# Patient Record
Sex: Female | Born: 1960 | Race: White | Hispanic: No | Marital: Married | Smoking: Never smoker
Health system: Southern US, Community
[De-identification: ages and names within clinical notes are randomized; demographics above are authoritative.]

## PROBLEM LIST (undated history)

## (undated) DIAGNOSIS — Z9889 Other specified postprocedural states: Secondary | ICD-10-CM

## (undated) DIAGNOSIS — N809 Endometriosis, unspecified: Secondary | ICD-10-CM

## (undated) DIAGNOSIS — J45909 Unspecified asthma, uncomplicated: Secondary | ICD-10-CM

## (undated) DIAGNOSIS — C4490 Unspecified malignant neoplasm of skin, unspecified: Secondary | ICD-10-CM

## (undated) DIAGNOSIS — E785 Hyperlipidemia, unspecified: Secondary | ICD-10-CM

## (undated) DIAGNOSIS — R112 Nausea with vomiting, unspecified: Secondary | ICD-10-CM

## (undated) DIAGNOSIS — E039 Hypothyroidism, unspecified: Secondary | ICD-10-CM

## (undated) DIAGNOSIS — M1990 Unspecified osteoarthritis, unspecified site: Secondary | ICD-10-CM

## (undated) DIAGNOSIS — Z87442 Personal history of urinary calculi: Secondary | ICD-10-CM

## (undated) DIAGNOSIS — G4700 Insomnia, unspecified: Secondary | ICD-10-CM

## (undated) DIAGNOSIS — Z8669 Personal history of other diseases of the nervous system and sense organs: Secondary | ICD-10-CM

## (undated) DIAGNOSIS — J189 Pneumonia, unspecified organism: Secondary | ICD-10-CM

## (undated) DIAGNOSIS — K219 Gastro-esophageal reflux disease without esophagitis: Secondary | ICD-10-CM

## (undated) HISTORY — PX: URETHRAL DILATION: SUR417

## (undated) HISTORY — PX: LAPAROSCOPY: SHX197

## (undated) HISTORY — PX: SKIN CANCER EXCISION: SHX779

## (undated) HISTORY — PX: AUGMENTATION MAMMAPLASTY: SUR837

## (undated) HISTORY — PX: ABDOMINAL HYSTERECTOMY: SHX81

---

## 2010-03-06 ENCOUNTER — Encounter: Admission: RE | Admit: 2010-03-06 | Discharge: 2010-03-06 | Payer: Self-pay | Admitting: Family Medicine

## 2014-06-28 ENCOUNTER — Other Ambulatory Visit: Payer: Self-pay

## 2014-06-28 DIAGNOSIS — Z1231 Encounter for screening mammogram for malignant neoplasm of breast: Secondary | ICD-10-CM

## 2014-08-09 ENCOUNTER — Ambulatory Visit
Admission: RE | Admit: 2014-08-09 | Discharge: 2014-08-09 | Disposition: A | Discharge status: Home / Self Care | Payer: BLUE CROSS/BLUE SHIELD | Source: Ambulatory Visit

## 2014-08-09 DIAGNOSIS — Z1231 Encounter for screening mammogram for malignant neoplasm of breast: Secondary | ICD-10-CM

## 2016-05-27 DIAGNOSIS — J45909 Unspecified asthma, uncomplicated: Secondary | ICD-10-CM | POA: Diagnosis not present

## 2016-06-17 ENCOUNTER — Other Ambulatory Visit: Payer: Self-pay | Admitting: Nurse Practitioner

## 2016-06-17 DIAGNOSIS — Z1231 Encounter for screening mammogram for malignant neoplasm of breast: Secondary | ICD-10-CM

## 2016-06-17 DIAGNOSIS — R5381 Other malaise: Secondary | ICD-10-CM

## 2016-06-18 ENCOUNTER — Other Ambulatory Visit: Payer: Self-pay | Admitting: Nurse Practitioner

## 2016-06-18 DIAGNOSIS — E2839 Other primary ovarian failure: Secondary | ICD-10-CM

## 2016-08-01 ENCOUNTER — Ambulatory Visit
Admission: RE | Admit: 2016-08-01 | Discharge: 2016-08-01 | Disposition: A | Discharge status: Home / Self Care | Payer: BLUE CROSS/BLUE SHIELD | Source: Ambulatory Visit | Attending: Nurse Practitioner | Admitting: Nurse Practitioner

## 2016-08-01 DIAGNOSIS — Z780 Asymptomatic menopausal state: Secondary | ICD-10-CM | POA: Diagnosis not present

## 2016-08-01 DIAGNOSIS — E2839 Other primary ovarian failure: Secondary | ICD-10-CM

## 2016-08-01 DIAGNOSIS — Z1231 Encounter for screening mammogram for malignant neoplasm of breast: Secondary | ICD-10-CM

## 2016-08-01 DIAGNOSIS — Z13820 Encounter for screening for osteoporosis: Secondary | ICD-10-CM | POA: Diagnosis not present

## 2016-08-06 ENCOUNTER — Other Ambulatory Visit: Payer: Self-pay | Admitting: Nurse Practitioner

## 2016-08-06 DIAGNOSIS — R928 Other abnormal and inconclusive findings on diagnostic imaging of breast: Secondary | ICD-10-CM

## 2016-08-13 ENCOUNTER — Ambulatory Visit
Admission: RE | Admit: 2016-08-13 | Discharge: 2016-08-13 | Disposition: A | Discharge status: Home / Self Care | Payer: BLUE CROSS/BLUE SHIELD | Source: Ambulatory Visit | Attending: Nurse Practitioner | Admitting: Nurse Practitioner

## 2016-08-13 DIAGNOSIS — R928 Other abnormal and inconclusive findings on diagnostic imaging of breast: Secondary | ICD-10-CM

## 2016-08-13 DIAGNOSIS — N6311 Unspecified lump in the right breast, upper outer quadrant: Secondary | ICD-10-CM | POA: Diagnosis not present

## 2016-09-25 DIAGNOSIS — E039 Hypothyroidism, unspecified: Secondary | ICD-10-CM | POA: Diagnosis not present

## 2016-09-25 DIAGNOSIS — E781 Pure hyperglyceridemia: Secondary | ICD-10-CM | POA: Diagnosis not present

## 2016-09-25 DIAGNOSIS — E782 Mixed hyperlipidemia: Secondary | ICD-10-CM | POA: Diagnosis not present

## 2016-09-30 DIAGNOSIS — J4520 Mild intermittent asthma, uncomplicated: Secondary | ICD-10-CM | POA: Diagnosis not present

## 2016-09-30 DIAGNOSIS — G4700 Insomnia, unspecified: Secondary | ICD-10-CM | POA: Diagnosis not present

## 2016-09-30 DIAGNOSIS — E782 Mixed hyperlipidemia: Secondary | ICD-10-CM | POA: Diagnosis not present

## 2016-09-30 DIAGNOSIS — E039 Hypothyroidism, unspecified: Secondary | ICD-10-CM | POA: Diagnosis not present

## 2016-11-25 DIAGNOSIS — R946 Abnormal results of thyroid function studies: Secondary | ICD-10-CM | POA: Diagnosis not present

## 2016-11-25 DIAGNOSIS — M170 Bilateral primary osteoarthritis of knee: Secondary | ICD-10-CM | POA: Diagnosis not present

## 2017-02-19 DIAGNOSIS — R21 Rash and other nonspecific skin eruption: Secondary | ICD-10-CM | POA: Diagnosis not present

## 2017-02-19 DIAGNOSIS — L03811 Cellulitis of head [any part, except face]: Secondary | ICD-10-CM | POA: Diagnosis not present

## 2017-04-30 DIAGNOSIS — I10 Essential (primary) hypertension: Secondary | ICD-10-CM | POA: Diagnosis not present

## 2017-04-30 DIAGNOSIS — R739 Hyperglycemia, unspecified: Secondary | ICD-10-CM | POA: Diagnosis not present

## 2017-04-30 DIAGNOSIS — E7800 Pure hypercholesterolemia, unspecified: Secondary | ICD-10-CM | POA: Diagnosis not present

## 2017-04-30 DIAGNOSIS — Z23 Encounter for immunization: Secondary | ICD-10-CM | POA: Diagnosis not present

## 2017-05-19 DIAGNOSIS — H43812 Vitreous degeneration, left eye: Secondary | ICD-10-CM | POA: Diagnosis not present

## 2017-05-19 DIAGNOSIS — H43822 Vitreomacular adhesion, left eye: Secondary | ICD-10-CM | POA: Diagnosis not present

## 2017-05-19 DIAGNOSIS — H3581 Retinal edema: Secondary | ICD-10-CM | POA: Diagnosis not present

## 2017-06-23 DIAGNOSIS — H43822 Vitreomacular adhesion, left eye: Secondary | ICD-10-CM | POA: Diagnosis not present

## 2017-06-23 DIAGNOSIS — H3581 Retinal edema: Secondary | ICD-10-CM | POA: Diagnosis not present

## 2017-08-24 DIAGNOSIS — H3581 Retinal edema: Secondary | ICD-10-CM | POA: Diagnosis not present

## 2017-08-24 DIAGNOSIS — L02223 Furuncle of chest wall: Secondary | ICD-10-CM | POA: Diagnosis not present

## 2017-08-24 DIAGNOSIS — H43822 Vitreomacular adhesion, left eye: Secondary | ICD-10-CM | POA: Diagnosis not present

## 2017-09-21 ENCOUNTER — Other Ambulatory Visit: Payer: Self-pay | Admitting: Family Medicine

## 2017-09-21 DIAGNOSIS — Z139 Encounter for screening, unspecified: Secondary | ICD-10-CM

## 2017-10-08 ENCOUNTER — Encounter: Payer: Self-pay | Admitting: Radiology

## 2017-10-08 ENCOUNTER — Ambulatory Visit
Admission: RE | Admit: 2017-10-08 | Discharge: 2017-10-08 | Disposition: A | Discharge status: Home / Self Care | Payer: BLUE CROSS/BLUE SHIELD | Source: Ambulatory Visit | Attending: Family Medicine | Admitting: Family Medicine

## 2017-10-08 DIAGNOSIS — Z139 Encounter for screening, unspecified: Secondary | ICD-10-CM

## 2017-10-08 DIAGNOSIS — Z1231 Encounter for screening mammogram for malignant neoplasm of breast: Secondary | ICD-10-CM | POA: Diagnosis not present

## 2017-10-12 ENCOUNTER — Other Ambulatory Visit: Payer: Self-pay | Admitting: Family Medicine

## 2017-10-12 DIAGNOSIS — R928 Other abnormal and inconclusive findings on diagnostic imaging of breast: Secondary | ICD-10-CM

## 2017-10-26 ENCOUNTER — Ambulatory Visit: Payer: BLUE CROSS/BLUE SHIELD

## 2017-10-26 ENCOUNTER — Ambulatory Visit
Admission: RE | Admit: 2017-10-26 | Discharge: 2017-10-26 | Disposition: A | Discharge status: Home / Self Care | Payer: BLUE CROSS/BLUE SHIELD | Source: Ambulatory Visit | Attending: Family Medicine | Admitting: Family Medicine

## 2017-10-26 DIAGNOSIS — R922 Inconclusive mammogram: Secondary | ICD-10-CM | POA: Diagnosis not present

## 2017-10-26 DIAGNOSIS — R928 Other abnormal and inconclusive findings on diagnostic imaging of breast: Secondary | ICD-10-CM

## 2017-11-11 DIAGNOSIS — L720 Epidermal cyst: Secondary | ICD-10-CM | POA: Diagnosis not present

## 2018-02-27 DIAGNOSIS — H00016 Hordeolum externum left eye, unspecified eyelid: Secondary | ICD-10-CM | POA: Diagnosis not present

## 2018-02-27 DIAGNOSIS — R220 Localized swelling, mass and lump, head: Secondary | ICD-10-CM | POA: Diagnosis not present

## 2018-07-26 DIAGNOSIS — M1712 Unilateral primary osteoarthritis, left knee: Secondary | ICD-10-CM | POA: Diagnosis not present

## 2018-07-26 DIAGNOSIS — M25562 Pain in left knee: Secondary | ICD-10-CM | POA: Diagnosis not present

## 2018-08-19 DIAGNOSIS — M25562 Pain in left knee: Secondary | ICD-10-CM | POA: Diagnosis not present

## 2018-08-25 DIAGNOSIS — M25562 Pain in left knee: Secondary | ICD-10-CM | POA: Diagnosis not present

## 2018-08-27 DIAGNOSIS — M25562 Pain in left knee: Secondary | ICD-10-CM | POA: Diagnosis not present

## 2018-08-30 DIAGNOSIS — M25562 Pain in left knee: Secondary | ICD-10-CM | POA: Diagnosis not present

## 2018-09-07 DIAGNOSIS — M25562 Pain in left knee: Secondary | ICD-10-CM | POA: Diagnosis not present

## 2018-09-09 DIAGNOSIS — M5432 Sciatica, left side: Secondary | ICD-10-CM | POA: Diagnosis not present

## 2018-09-15 DIAGNOSIS — M25562 Pain in left knee: Secondary | ICD-10-CM | POA: Diagnosis not present

## 2018-09-24 DIAGNOSIS — M25562 Pain in left knee: Secondary | ICD-10-CM | POA: Diagnosis not present

## 2018-10-01 DIAGNOSIS — M25562 Pain in left knee: Secondary | ICD-10-CM | POA: Diagnosis not present

## 2018-10-13 DIAGNOSIS — M25562 Pain in left knee: Secondary | ICD-10-CM | POA: Diagnosis not present

## 2019-02-01 DIAGNOSIS — R3915 Urgency of urination: Secondary | ICD-10-CM | POA: Diagnosis not present

## 2019-06-09 DIAGNOSIS — Z87898 Personal history of other specified conditions: Secondary | ICD-10-CM | POA: Diagnosis not present

## 2019-06-09 DIAGNOSIS — Z6832 Body mass index (BMI) 32.0-32.9, adult: Secondary | ICD-10-CM | POA: Diagnosis not present

## 2019-06-09 DIAGNOSIS — E7800 Pure hypercholesterolemia, unspecified: Secondary | ICD-10-CM | POA: Diagnosis not present

## 2019-10-06 ENCOUNTER — Ambulatory Visit: Payer: BC Managed Care – PPO | Attending: Internal Medicine

## 2019-10-06 DIAGNOSIS — Z23 Encounter for immunization: Secondary | ICD-10-CM | POA: Insufficient documentation

## 2019-10-06 NOTE — Progress Notes (Signed)
   Covid-19 Vaccination Clinic  Name:  [name]    MRN: [id number] DOB: July 24, 1961  10/06/2019  [name] was observed post Covid-19 immunization for 30 minutes based on pre-vaccination screening without incident. She was provided with Vaccine Information Sheet and instruction to access the V-Safe system.   [name] was instructed to call 911 with any severe reactions post vaccine: Marland Kitchen Difficulty breathing  . Swelling of face and throat  . A fast heartbeat  . A bad rash all over body  . Dizziness and weakness   Immunizations Administered    Name Date Dose VIS Date Route   Pfizer COVID-19 Vaccine 10/06/2019  8:25 AM 0.3 mL 07/15/2019 Intramuscular   Manufacturer: [place]   Lot: [id number]   [place]: [id number]

## 2019-11-02 ENCOUNTER — Ambulatory Visit: Payer: BC Managed Care – PPO | Attending: Internal Medicine

## 2019-11-02 DIAGNOSIS — Z23 Encounter for immunization: Secondary | ICD-10-CM

## 2019-11-02 NOTE — Progress Notes (Signed)
   Covid-19 Vaccination Clinic  Name:  [name]    MRN: [id number] DOB: 05/03/1961  11/02/2019  [name] was observed post Covid-19 immunization for 15 minutes without incident. She was provided with Vaccine Information Sheet and instruction to access the V-Safe system.   [name] was instructed to call 911 with any severe reactions post vaccine: Marland Kitchen Difficulty breathing  . Swelling of face and throat  . A fast heartbeat  . A bad rash all over body  . Dizziness and weakness   Immunizations Administered    Name Date Dose VIS Date Route   Pfizer COVID-19 Vaccine 11/02/2019 10:00 AM 0.3 mL 07/15/2019 Intramuscular   Manufacturer: [place]   Lot: [id number]   [place]: [id number]

## 2019-12-13 ENCOUNTER — Other Ambulatory Visit: Payer: Self-pay | Admitting: Family Medicine

## 2019-12-13 DIAGNOSIS — Z1231 Encounter for screening mammogram for malignant neoplasm of breast: Secondary | ICD-10-CM

## 2020-02-17 ENCOUNTER — Other Ambulatory Visit: Payer: Self-pay

## 2020-02-17 ENCOUNTER — Ambulatory Visit
Admission: RE | Admit: 2020-02-17 | Discharge: 2020-02-17 | Disposition: A | Discharge status: Home / Self Care | Payer: BC Managed Care – PPO | Source: Ambulatory Visit | Attending: Family Medicine | Admitting: Family Medicine

## 2020-02-17 DIAGNOSIS — Z1231 Encounter for screening mammogram for malignant neoplasm of breast: Secondary | ICD-10-CM | POA: Diagnosis not present

## 2020-04-30 DIAGNOSIS — M25512 Pain in left shoulder: Secondary | ICD-10-CM | POA: Diagnosis not present

## 2020-04-30 DIAGNOSIS — M542 Cervicalgia: Secondary | ICD-10-CM | POA: Diagnosis not present

## 2020-04-30 DIAGNOSIS — M25561 Pain in right knee: Secondary | ICD-10-CM | POA: Diagnosis not present

## 2020-05-14 DIAGNOSIS — N6489 Other specified disorders of breast: Secondary | ICD-10-CM | POA: Diagnosis not present

## 2020-05-14 DIAGNOSIS — E7800 Pure hypercholesterolemia, unspecified: Secondary | ICD-10-CM | POA: Diagnosis not present

## 2020-05-14 DIAGNOSIS — I10 Essential (primary) hypertension: Secondary | ICD-10-CM | POA: Diagnosis not present

## 2020-05-14 DIAGNOSIS — Z87898 Personal history of other specified conditions: Secondary | ICD-10-CM | POA: Diagnosis not present

## 2020-06-05 DIAGNOSIS — N6489 Other specified disorders of breast: Secondary | ICD-10-CM | POA: Diagnosis not present

## 2020-10-16 DIAGNOSIS — M1711 Unilateral primary osteoarthritis, right knee: Secondary | ICD-10-CM | POA: Diagnosis not present

## 2020-10-22 DIAGNOSIS — M1711 Unilateral primary osteoarthritis, right knee: Secondary | ICD-10-CM | POA: Diagnosis not present

## 2020-10-23 DIAGNOSIS — I10 Essential (primary) hypertension: Secondary | ICD-10-CM | POA: Diagnosis not present

## 2020-10-23 DIAGNOSIS — M25569 Pain in unspecified knee: Secondary | ICD-10-CM | POA: Diagnosis not present

## 2020-11-07 ENCOUNTER — Ambulatory Visit (INDEPENDENT_AMBULATORY_CARE_PROVIDER_SITE_OTHER): Payer: BC Managed Care – PPO | Admitting: Orthopaedic Surgery

## 2020-11-07 VITALS — Ht 67.0 in | Wt 200.0 lb

## 2020-11-07 DIAGNOSIS — M1711 Unilateral primary osteoarthritis, right knee: Secondary | ICD-10-CM | POA: Diagnosis not present

## 2020-11-07 DIAGNOSIS — G8929 Other chronic pain: Secondary | ICD-10-CM

## 2020-11-07 DIAGNOSIS — M25561 Pain in right knee: Secondary | ICD-10-CM | POA: Diagnosis not present

## 2020-11-07 NOTE — Progress Notes (Signed)
Office Visit Note   Patient: [name]           Date of Birth: 25-Jun-1961           MRN: [id number] Visit Date: 11/07/2020              Requested by: Glenford Bayley, DO [place],  [place] [address] PCP: Glenford Bayley, DO   Assessment & Plan: Visit Diagnoses:  1. Chronic pain of right knee   2. Unilateral primary osteoarthritis, right knee     Plan: At this point I agree with the other orthopedic surgeon that a knee replacement can be helpful for her.  I showed her knee model explained in detail what the surgery involves including a better understanding of the risk and benefits of surgery.  We talked about the interoperative and postoperative course and what to expect.  Given the failure of conservative treatment for over 12 years including all the modalities previously listed, I do feel she is a candidate for this as does she.  She is interested in having this scheduled soon.  All questions and concerns were answered and addressed.  Follow-Up Instructions: Return for 2 weeks post-op.   Orders:  No orders of the defined types were placed in this encounter.  No orders of the defined types were placed in this encounter.     Procedures: No procedures performed   Clinical Data: No additional findings.   Subjective: Chief Complaint  Patient presents with  . Right Knee - Pain  The patient has some I am seeing for the first time but she is asked to see an orthopedic colleague in town who is recommended knee replacement surgery to her.  She came to me at the request of her friend of hers.  She has been dealing with right knee pain for many years now.  She has never had surgery on that knee but she has been documented with having severe end-stage arthritis of the right knee.  She has tried and failed other forms of conservative treatment including activity modification, holding back on her activities, weight loss, anti-inflammatories and numerous injections.  At this point her  right knee pain is definitely affecting her mobility, her quality of life and her actives daily living.  She is someone who is an avid outdoor person and does do a lot of hiking and is very active with her husband in terms of the activities of like to do outside.  She does get a lot of pain at night that wakes her up at night.  It is very stiff when she has been sitting for long period time or riding in a car.  The knee also pops and cracks quite a bit with her right knee.  She is not a diabetic.  She is not on blood thinning medications.  She has asthma.  She is not a smoker.  HPI  Review of Systems She currently denies any headache, chest pain, shortness of breath, fever, chills, nausea, vomiting  Objective: Vital Signs: Ht 5\' 7"  (1.702 m)   Wt 200 lb (90.7 kg)   BMI 31.32 kg/m   Physical Exam She is alert and orient x3 and in no acute distress Ortho Exam Examination of the right knee show significant patellofemoral crepitation.  There is some warmth in the knee and a mild effusion.  She has varus malalignment that is correctable.  Knee is ligamentously stable but has a painful arc  of motion that its arc of motion. Specialty Comments:  No specialty comments available.  Imaging: No results found. X-rays that accompany her of the right knee are independently reviewed.  This shows severe end-stage arthritis with varus malalignment, complete loss of the medial joint space and patellofemoral arthritic changes that are quite severe.  There are osteophytes in all 3 compartments.  PMFS History: Patient Active Problem List   Diagnosis Date Noted  . Unilateral primary osteoarthritis, right knee 11/07/2020   No past medical history on file.  No family history on file.   Social History   Occupational History  . Not on file  Tobacco Use  . Smoking status: Not on file  . Smokeless tobacco: Not on file  Substance and Sexual Activity  . Alcohol use: Not on file  . Drug use: Not on file  .  Sexual activity: Not on file

## 2020-11-27 ENCOUNTER — Other Ambulatory Visit: Payer: Self-pay

## 2020-12-04 ENCOUNTER — Other Ambulatory Visit: Payer: Self-pay | Admitting: Physician Assistant

## 2020-12-07 ENCOUNTER — Encounter (HOSPITAL_COMMUNITY): Payer: Self-pay

## 2020-12-07 NOTE — Patient Instructions (Addendum)
DUE TO COVID-19 ONLY ONE VISITOR IS ALLOWED TO COME WITH YOU AND STAY IN THE WAITING ROOM ONLY DURING PRE OP AND PROCEDURE.   **NO VISITORS ARE ALLOWED IN THE SHORT STAY AREA OR RECOVERY ROOM!!**  IF YOU WILL BE ADMITTED INTO THE HOSPITAL YOU ARE ALLOWED ONLY TWO SUPPORT PEOPLE DURING VISITATION HOURS ONLY (10AM -8PM)   . The support person(s) may change daily. . The support person(s) must pass our screening, gel in and out, and wear a mask at all times, including in the patient's room. . Patients must also wear a mask when staff or their support person are in the room.  No visitors under the age of 18. Any visitor under the age of 10 must be accompanied by an adult.    COVID SWAB TESTING MUST BE COMPLETED ON:  Wednesday, 12-19-20 @ 9:30 AM   [address] [place], [place] [address]         Your procedure is scheduled on:  Friday, 12-21-20   Report to [facility] Main  Entrance   Report to Short Stay at 5:15 AM   [facility])   Call this number if you have problems the morning of surgery [phone]   Do not eat food :After Midnight.   May have liquids until 4:15 AM day of surgery  CLEAR LIQUID DIET  Foods Allowed                                                                     Foods Excluded  Water, Black Coffee and tea, regular and decaf            liquids that you cannot  Plain Jell-O in any flavor  (No red)                                   see through such as: Fruit ices (not with fruit pulp)                                      milk, soups, orange juice              Iced Popsicles (No red)                                      All solid food                                   Apple juices Sports drinks like Gatorade (No red) Lightly seasoned clear broth or consume(fat free) Sugar, honey syrup     Complete one Ensure drink the morning of surgery at 4:15 AM  the day of surgery.      1. The day of surgery:  ? Drink ONE (1) Pre-Surgery Clear Ensure or G2 by  am the morning of surgery. Drink in one sitting. Do not sip.  ? This drink was given to you during your hospital  pre-op appointment  visit. ? Nothing else to drink after completing the  Pre-Surgery Clear Ensure or G2.          If you have questions, please contact your surgeon's office.     Oral Hygiene is also important to reduce your risk of infection.                                    Remember - BRUSH YOUR TEETH THE MORNING OF SURGERY WITH YOUR REGULAR TOOTHPASTE   Do NOT smoke after Midnight   Take these medicines the morning of surgery with A SIP OF WATER: Pantoprazole.  Okay to use Albuterol inhaler and bring with you day of  surgery                                You may not have any metal on your body including hair pins, jewelry, and body piercings             Do not wear make-up, lotions, powders, perfumes/cologne, or deodorant             Do not wear nail polish.  Do not shave  48 hours prior to surgery.             Do not bring valuables to the hospital. [place].   Contacts, dentures or bridgework may not be worn into surgery.   Bring small overnight bag day of surgery.                 Please read over the following fact sheets you were given: IF YOU HAVE QUESTIONS ABOUT YOUR PRE OP INSTRUCTIONS PLEASE CALL  [facility] - Preparing for Surgery Before surgery, you can play an important role.  Because skin is not sterile, your skin needs to be as free of germs as possible.  You can reduce the number of germs on your skin by washing with CHG (chlorahexidine gluconate) soap before surgery.  CHG is an antiseptic cleaner which kills germs and bonds with the skin to continue killing germs even after washing. Please DO NOT use if you have an allergy to CHG or antibacterial soaps.  If your skin becomes reddened/irritated stop using the CHG and inform your nurse when you arrive at Short Stay. Do not shave (including legs  and underarms) for at least 48 hours prior to the first CHG shower.  You may shave your face/neck.  Please follow these instructions carefully:  1.  Shower with CHG Soap the night before surgery and the  morning of surgery.  2.  If you choose to wash your hair, wash your hair first as usual with your normal  shampoo.  3.  After you shampoo, rinse your hair and body thoroughly to remove the shampoo.                             4.  Use CHG as you would any other liquid soap.  You can apply chg directly to the skin and wash.  Gently with a scrungie or clean washcloth.  5.  Apply the CHG Soap to your body ONLY FROM THE NECK DOWN.   Do   not use on face/ open  Wound or open sores. Avoid contact with eyes, ears mouth and   genitals (private parts).                       Wash face,  Genitals (private parts) with your normal soap.             6.  Wash thoroughly, paying special attention to the area where your    surgery  will be performed.  7.  Thoroughly rinse your body with warm water from the neck down.  8.  DO NOT shower/wash with your normal soap after using and rinsing off the CHG Soap.                9.  Pat yourself dry with a clean towel.            10.  Wear clean pajamas.            11.  Place clean sheets on your bed the night of your first shower and do not  sleep with pets. Day of Surgery : Do not apply any lotions/deodorants the morning of surgery.  Please wear clean clothes to the hospital/surgery center.  FAILURE TO FOLLOW THESE INSTRUCTIONS MAY RESULT IN THE CANCELLATION OF YOUR SURGERY  PATIENT SIGNATURE_________________________________  NURSE SIGNATURE__________________________________  ________________________________________________________________________   Adam Phenix  An incentive spirometer is a tool that can help keep your lungs clear and active. This tool measures how well you are filling your lungs with each breath. Taking long  deep breaths may help reverse or decrease the chance of developing breathing (pulmonary) problems (especially infection) following:  A long period of time when you are unable to move or be active. BEFORE THE PROCEDURE   If the spirometer includes an indicator to show your best effort, your nurse or respiratory therapist will set it to a desired goal.  If possible, sit up straight or lean slightly forward. Try not to slouch.  Hold the incentive spirometer in an upright position. INSTRUCTIONS FOR USE  1. Sit on the edge of your bed if possible, or sit up as far as you can in bed or on a chair. 2. Hold the incentive spirometer in an upright position. 3. Breathe out normally. 4. Place the mouthpiece in your mouth and seal your lips tightly around it. 5. Breathe in slowly and as deeply as possible, raising the piston or the ball toward the top of the column. 6. Hold your breath for 3-5 seconds or for as long as possible. Allow the piston or ball to fall to the bottom of the column. 7. Remove the mouthpiece from your mouth and breathe out normally. 8. Rest for a few seconds and repeat Steps 1 through 7 at least 10 times every 1-2 hours when you are awake. Take your time and take a few normal breaths between deep breaths. 9. The spirometer may include an indicator to show your best effort. Use the indicator as a goal to work toward during each repetition. 10. After each set of 10 deep breaths, practice coughing to be sure your lungs are clear. If you have an incision (the cut made at the time of surgery), support your incision when coughing by placing a pillow or rolled up towels firmly against it. Once you are able to get out of bed, walk around indoors and cough well. You may stop using the incentive spirometer when instructed by your caregiver.  RISKS AND COMPLICATIONS  Take your time  so you do not get dizzy or light-headed.  If you are in pain, you may need to take or ask for pain medication  before doing incentive spirometry. It is harder to take a deep breath if you are having pain. AFTER USE  Rest and breathe slowly and easily.  It can be helpful to keep track of a log of your progress. Your caregiver can provide you with a simple table to help with this. If you are using the spirometer at home, follow these instructions: [place] IF:   You are having difficultly using the spirometer.  You have trouble using the spirometer as often as instructed.  Your pain medication is not giving enough relief while using the spirometer.  You develop fever of 100.5 F (38.1 C) or higher. SEEK IMMEDIATE MEDICAL CARE IF:   You cough up bloody sputum that had not been present before.  You develop fever of 102 F (38.9 C) or greater.  You develop worsening pain at or near the incision site. MAKE SURE YOU:   Understand these instructions.  Will watch your condition.  Will get help right away if you are not doing well or get worse. Document Released: 12/01/2006 Document Revised: 10/13/2011 Document Reviewed: 02/01/2007 ExitCare Patient Information 2014 ExitCare, [place].   ________________________________________________________________________  WHAT IS A BLOOD TRANSFUSION? Blood Transfusion Information  A transfusion is the replacement of blood or some of its parts. Blood is made up of multiple cells which provide different functions.  Red blood cells carry oxygen and are used for blood loss replacement.  White blood cells fight against infection.  Platelets control bleeding.  Plasma helps clot blood.  Other blood products are available for specialized needs, such as hemophilia or other clotting disorders. BEFORE THE TRANSFUSION  Who gives blood for transfusions?   Healthy volunteers who are fully evaluated to make sure their blood is safe. This is blood bank blood. Transfusion therapy is the safest it has ever been in the practice of medicine. Before blood is  taken from a donor, a complete history is taken to make sure that person has no history of diseases nor engages in risky social behavior (examples are intravenous drug use or sexual activity with multiple partners). The donor's travel history is screened to minimize risk of transmitting infections, such as malaria. The donated blood is tested for signs of infectious diseases, such as HIV and hepatitis. The blood is then tested to be sure it is compatible with you in order to minimize the chance of a transfusion reaction. If you or a relative donates blood, this is often done in anticipation of surgery and is not appropriate for emergency situations. It takes many days to process the donated blood. RISKS AND COMPLICATIONS Although transfusion therapy is very safe and saves many lives, the main dangers of transfusion include:   Getting an infectious disease.  Developing a transfusion reaction. This is an allergic reaction to something in the blood you were given. Every precaution is taken to prevent this. The decision to have a blood transfusion has been considered carefully by your caregiver before blood is given. Blood is not given unless the benefits outweigh the risks. AFTER THE TRANSFUSION  Right after receiving a blood transfusion, you will usually feel much better and more energetic. This is especially true if your red blood cells have gotten low (anemic). The transfusion raises the level of the red blood cells which carry oxygen, and this usually causes an energy increase.  The  nurse administering the transfusion will monitor you carefully for complications. HOME CARE INSTRUCTIONS  No special instructions are needed after a transfusion. You may find your energy is better. Speak with your caregiver about any limitations on activity for underlying diseases you may have. SEEK MEDICAL CARE IF:   Your condition is not improving after your transfusion.  You develop redness or irritation at the  intravenous (IV) site. SEEK IMMEDIATE MEDICAL CARE IF:  Any of the following symptoms occur over the next 12 hours:  Shaking chills.  You have a temperature by mouth above 102 F (38.9 C), not controlled by medicine.  Chest, back, or muscle pain.  People around you feel you are not acting correctly or are confused.  Shortness of breath or difficulty breathing.  Dizziness and fainting.  You get a rash or develop hives.  You have a decrease in urine output.  Your urine turns a dark color or changes to pink, red, or brown. Any of the following symptoms occur over the next 10 days:  You have a temperature by mouth above 102 F (38.9 C), not controlled by medicine.  Shortness of breath.  Weakness after normal activity.  The white part of the eye turns yellow (jaundice).  You have a decrease in the amount of urine or are urinating less often.  Your urine turns a dark color or changes to pink, red, or brown. Document Released: 07/18/2000 Document Revised: 10/13/2011 Document Reviewed: 03/06/2008 [facility] Patient Information 2014 [place], [place].  _______________________________________________________________________

## 2020-12-07 NOTE — Progress Notes (Addendum)
COVID Vaccine Completed: x4 Date COVID Vaccine completed: 10-06-19, 11-02-19 Has received booster: 05-07-20, 12-05-20 COVID vaccine manufacturer: [place]     Date of COVID positive in last 90 days: N/A  PCP - Ulysees Barns, NP Cardiologist - N/A  Chest x-ray - N/A EKG - 2022 at PCP Stress Test - N/A ECHO - N/A Cardiac Cath - N/A Pacemaker/ICD device last checked: Spinal Cord Stimulator:  Sleep Study - N/A CPAP -   Fasting Blood Sugar - N/A Checks Blood Sugar _____ times a day  Blood Thinner Instructions:  N/A Aspirin Instructions: Last Dose:  Activity level:  Can go up a flight of stairs and perform activities of daily living without stopping and without symptoms of chest pain or shortness of breath.  Able to exercise without symptoms of chest pain or shortness of breath.  Patient walks daily with no issues except joint pain.   Anesthesia review: N/A  Patient denies shortness of breath, fever, cough and chest pain at PAT appointment   Patient verbalized understanding of instructions that were given to them at the PAT appointment. Patient was also instructed that they will need to review over the PAT instructions again at home before surgery.

## 2020-12-11 ENCOUNTER — Other Ambulatory Visit: Payer: Self-pay

## 2020-12-11 ENCOUNTER — Encounter (HOSPITAL_COMMUNITY)
Admission: RE | Admit: 2020-12-11 | Discharge: 2020-12-11 | Disposition: A | Discharge status: Home / Self Care | Payer: BC Managed Care – PPO | Source: Ambulatory Visit | Attending: Orthopaedic Surgery | Admitting: Orthopaedic Surgery

## 2020-12-11 ENCOUNTER — Encounter (HOSPITAL_COMMUNITY): Payer: Self-pay

## 2020-12-11 DIAGNOSIS — Z01812 Encounter for preprocedural laboratory examination: Secondary | ICD-10-CM | POA: Diagnosis not present

## 2020-12-11 HISTORY — DX: Other specified postprocedural states: Z98.890

## 2020-12-11 HISTORY — DX: Hypothyroidism, unspecified: E03.9

## 2020-12-11 HISTORY — DX: Gastro-esophageal reflux disease without esophagitis: K21.9

## 2020-12-11 HISTORY — DX: Pneumonia, unspecified organism: J18.9

## 2020-12-11 HISTORY — DX: Unspecified malignant neoplasm of skin, unspecified: C44.90

## 2020-12-11 HISTORY — DX: Personal history of other diseases of the nervous system and sense organs: Z86.69

## 2020-12-11 HISTORY — DX: Unspecified osteoarthritis, unspecified site: M19.90

## 2020-12-11 HISTORY — DX: Insomnia, unspecified: G47.00

## 2020-12-11 HISTORY — DX: Unspecified asthma, uncomplicated: J45.909

## 2020-12-11 HISTORY — DX: Endometriosis, unspecified: N80.9

## 2020-12-11 HISTORY — DX: Other specified postprocedural states: R11.2

## 2020-12-11 HISTORY — DX: Personal history of urinary calculi: Z87.442

## 2020-12-11 HISTORY — DX: Hyperlipidemia, unspecified: E78.5

## 2020-12-11 LAB — CBC
HCT: 41.5 % (ref 36.0–46.0)
Hemoglobin: 13.4 g/dL (ref 12.0–15.0)
MCH: 29.8 pg (ref 26.0–34.0)
MCHC: 32.3 g/dL (ref 30.0–36.0)
MCV: 92.4 fL (ref 80.0–100.0)
Platelets: 353 10*3/uL (ref 150–400)
RBC: 4.49 MIL/uL (ref 3.87–5.11)
RDW: 13.2 % (ref 11.5–15.5)
WBC: 6.2 10*3/uL (ref 4.0–10.5)
nRBC: 0 % (ref 0.0–0.2)

## 2020-12-11 LAB — SURGICAL PCR SCREEN
MRSA, PCR: NEGATIVE
Staphylococcus aureus: NEGATIVE

## 2020-12-19 ENCOUNTER — Other Ambulatory Visit (HOSPITAL_COMMUNITY)
Admission: RE | Admit: 2020-12-19 | Discharge: 2020-12-19 | Disposition: A | Discharge status: Home / Self Care | Payer: BC Managed Care – PPO | Source: Ambulatory Visit | Attending: Orthopaedic Surgery | Admitting: Orthopaedic Surgery

## 2020-12-19 DIAGNOSIS — Z01812 Encounter for preprocedural laboratory examination: Secondary | ICD-10-CM | POA: Diagnosis not present

## 2020-12-19 DIAGNOSIS — Z20822 Contact with and (suspected) exposure to covid-19: Secondary | ICD-10-CM | POA: Diagnosis not present

## 2020-12-19 LAB — SARS CORONAVIRUS 2 (TAT 6-24 HRS): SARS Coronavirus 2: NEGATIVE

## 2020-12-20 NOTE — Anesthesia Preprocedure Evaluation (Addendum)
Anesthesia Evaluation  Patient identified by MRN, date of birth, ID band Patient awake    Reviewed: Allergy & Precautions, NPO status , Patient's Chart, lab work & pertinent test results  History of Anesthesia Complications (+) PONV and history of anesthetic complications  Airway Mallampati: III  TM Distance: >3 FB Neck ROM: Full    Dental no notable dental hx.    Pulmonary asthma ,    Pulmonary exam normal breath sounds clear to auscultation       Cardiovascular negative cardio ROS Normal cardiovascular exam Rhythm:Regular Rate:Normal     Neuro/Psych  Headaches, negative psych ROS   GI/Hepatic Neg liver ROS, GERD  Medicated and Controlled,  Endo/Other  Hypothyroidism   Renal/GU negative Renal ROS     Musculoskeletal  (+) Arthritis ,   Abdominal (+) + obese,   Peds  Hematology negative hematology ROS (+)   Anesthesia Other Findings Osteoarthritis Right Knee  Reproductive/Obstetrics                            Anesthesia Physical Anesthesia Plan  ASA: II  Anesthesia Plan: Regional and Spinal   Post-op Pain Management:    Induction: Intravenous  PONV Risk Score and Plan: 3 and Ondansetron, Dexamethasone, Propofol infusion, Midazolam and Treatment may vary due to age or medical condition  Airway Management Planned: Simple Face Mask  Additional Equipment:   Intra-op Plan:   Post-operative Plan:   Informed Consent: I have reviewed the patients History and Physical, chart, labs and discussed the procedure including the risks, benefits and alternatives for the proposed anesthesia with the patient or authorized representative who has indicated his/her understanding and acceptance.     Dental advisory given  Plan Discussed with: CRNA  Anesthesia Plan Comments:         Anesthesia Quick Evaluation

## 2020-12-20 NOTE — H&P (Signed)
TOTAL KNEE ADMISSION H&P  Patient is being admitted for right total knee arthroplasty.  Subjective:  Chief Complaint:right knee pain.  HPI: [name], 60 y.o. female, has a history of pain and functional disability in the right knee due to arthritis and has failed non-surgical conservative treatments for greater than 12 weeks to includeNSAID's and/or analgesics, corticosteriod injections, viscosupplementation injections, flexibility and strengthening excercises, supervised PT with diminished ADL's post treatment, use of assistive devices, weight reduction as appropriate and activity modification.  Onset of symptoms was gradual, starting 4 years ago with gradually worsening course since that time. The patient noted no past surgery on the right knee(s).  Patient currently rates pain in the right knee(s) at 10 out of 10 with activity. Patient has night pain, worsening of pain with activity and weight bearing, pain that interferes with activities of daily living, pain with passive range of motion, crepitus and joint swelling.  Patient has evidence of subchondral sclerosis, periarticular osteophytes and joint space narrowing by imaging studies. There is no active infection.  Patient Active Problem List   Diagnosis Date Noted  . Unilateral primary osteoarthritis, right knee 11/07/2020   Past Medical History:  Diagnosis Date  . Arthritis   . Asthma   . Endometriosis   . GERD (gastroesophageal reflux disease)   . History of kidney stones   . History of migraine headaches   . Hyperlipidemia   . Hypothyroidism   . Insomnia   . Pneumonia   . PONV (postoperative nausea and vomiting)   . Skin cancer    Face    Past Surgical History:  Procedure Laterality Date  . ABDOMINAL HYSTERECTOMY    . AUGMENTATION MAMMAPLASTY Bilateral   . LAPAROSCOPY    . SKIN CANCER EXCISION    . URETHRAL DILATION      No current facility-administered medications for this encounter.   Current Outpatient  Medications  Medication Sig Dispense Refill Last Dose  . albuterol (VENTOLIN HFA) 108 (90 Base) MCG/ACT inhaler Inhale 2 puffs into the lungs every 6 (six) hours as needed for wheezing or shortness of breath.     Marland Kitchen b complex vitamins capsule Take 1 capsule by mouth daily.     . cholecalciferol (VITAMIN D3) 25 MCG (1000 UNIT) tablet Take 1,000 Units by mouth daily.     Marland Kitchen Dextran 70-Hypromellose (ARTIFICIAL TEARS) 0.1-0.3 % SOLN Place 1 drop into both eyes daily as needed (dry eyes).     . fenofibrate 160 MG tablet Take 160 mg by mouth every evening.     Marland Kitchen ibuprofen (ADVIL) 200 MG tablet Take 400-600 mg by mouth every 8 (eight) hours as needed for moderate pain.     . montelukast (SINGULAIR) 10 MG tablet Take 10 mg by mouth at bedtime.     . Omega-3 Fatty Acids (FISH OIL) 1000 MG CAPS Take 1,000 mg by mouth daily.     . pantoprazole (PROTONIX) 20 MG tablet Take 20 mg by mouth every morning.     . sodium chloride (OCEAN) 0.65 % SOLN nasal spray Place 1 spray into both nostrils as needed for congestion.      Allergies  Allergen Reactions  . Nickel Dermatitis, Itching, Rash and Swelling  . Nitrofurantoin Shortness Of Breath  . Erythromycin Nausea And Vomiting and Other (See Comments)  . Sulfa Antibiotics Palpitations and Rash    Patient mentions redness of skin also.     Social History   Tobacco Use  . Smoking status: Never Smoker  .  Smokeless tobacco: Never Used  Substance Use Topics  . Alcohol use: Yes    Comment: Rare wine    No family history on file.   Review of Systems  Musculoskeletal: Positive for gait problem and joint swelling.  All other systems reviewed and are negative.   Objective:  Physical Exam Vitals reviewed.  Constitutional:      Appearance: Normal appearance.  HENT:     Head: Normocephalic and atraumatic.  Eyes:     Extraocular Movements: Extraocular movements intact.     Pupils: Pupils are equal, round, and reactive to light.  Cardiovascular:     Rate  and Rhythm: Normal rate and regular rhythm.     Pulses: Normal pulses.  Pulmonary:     Effort: Pulmonary effort is normal.     Breath sounds: Normal breath sounds.  Abdominal:     Palpations: Abdomen is soft.  Musculoskeletal:     Cervical back: Normal range of motion and neck supple.     Right knee: Effusion, bony tenderness and crepitus present. Decreased range of motion. Tenderness present over the medial joint line, lateral joint line and patellar tendon. Abnormal alignment and abnormal meniscus.  Neurological:     Mental Status: She is alert and oriented to person, place, and time.  Psychiatric:        Behavior: Behavior normal.     Vital signs in last 24 hours:    Labs:   Estimated body mass index is 31.95 kg/m as calculated from the following:   Height as of 12/11/20: 5\' 7"  (1.702 m).   Weight as of 12/11/20: 92.5 kg.   Imaging Review Plain radiographs demonstrate severe degenerative joint disease of the right knee(s). The overall alignment ismild varus. The bone quality appears to be good for age and reported activity level.      Assessment/Plan:  End stage arthritis, right knee   The patient history, physical examination, clinical judgment of the provider and imaging studies are consistent with end stage degenerative joint disease of the right knee(s) and total knee arthroplasty is deemed medically necessary. The treatment options including medical management, injection therapy arthroscopy and arthroplasty were discussed at length. The risks and benefits of total knee arthroplasty were presented and reviewed. The risks due to aseptic loosening, infection, stiffness, patella tracking problems, thromboembolic complications and other imponderables were discussed. The patient acknowledged the explanation, agreed to proceed with the plan and consent was signed. Patient is being admitted for inpatient treatment for surgery, pain control, PT, OT, prophylactic antibiotics, VTE  prophylaxis, progressive ambulation and ADL's and discharge planning. The patient is planning to be discharged home with home health services

## 2020-12-21 ENCOUNTER — Encounter (HOSPITAL_COMMUNITY): Payer: Self-pay | Admitting: Orthopaedic Surgery

## 2020-12-21 ENCOUNTER — Ambulatory Visit (HOSPITAL_COMMUNITY): Payer: BC Managed Care – PPO | Admitting: Anesthesiology

## 2020-12-21 ENCOUNTER — Inpatient Hospital Stay (HOSPITAL_COMMUNITY)
Admission: RE | Admit: 2020-12-21 | Discharge: 2020-12-25 | DRG: 470 | Disposition: A | Discharge status: Home Health | Payer: BC Managed Care – PPO | Source: Ambulatory Visit | Attending: Orthopaedic Surgery | Admitting: Orthopaedic Surgery

## 2020-12-21 ENCOUNTER — Observation Stay (HOSPITAL_COMMUNITY): Payer: BC Managed Care – PPO

## 2020-12-21 ENCOUNTER — Other Ambulatory Visit: Payer: Self-pay

## 2020-12-21 ENCOUNTER — Encounter (HOSPITAL_COMMUNITY)
Admission: RE | Disposition: A | Discharge status: Home Health | Payer: Self-pay | Source: Ambulatory Visit | Attending: Orthopaedic Surgery

## 2020-12-21 DIAGNOSIS — Z471 Aftercare following joint replacement surgery: Secondary | ICD-10-CM | POA: Diagnosis not present

## 2020-12-21 DIAGNOSIS — E039 Hypothyroidism, unspecified: Secondary | ICD-10-CM | POA: Diagnosis not present

## 2020-12-21 DIAGNOSIS — Z882 Allergy status to sulfonamides status: Secondary | ICD-10-CM | POA: Diagnosis not present

## 2020-12-21 DIAGNOSIS — Z91048 Other nonmedicinal substance allergy status: Secondary | ICD-10-CM | POA: Diagnosis not present

## 2020-12-21 DIAGNOSIS — Z85828 Personal history of other malignant neoplasm of skin: Secondary | ICD-10-CM

## 2020-12-21 DIAGNOSIS — Z79899 Other long term (current) drug therapy: Secondary | ICD-10-CM | POA: Diagnosis not present

## 2020-12-21 DIAGNOSIS — Z96651 Presence of right artificial knee joint: Secondary | ICD-10-CM

## 2020-12-21 DIAGNOSIS — E785 Hyperlipidemia, unspecified: Secondary | ICD-10-CM | POA: Diagnosis not present

## 2020-12-21 DIAGNOSIS — J45909 Unspecified asthma, uncomplicated: Secondary | ICD-10-CM | POA: Diagnosis not present

## 2020-12-21 DIAGNOSIS — R519 Headache, unspecified: Secondary | ICD-10-CM | POA: Diagnosis not present

## 2020-12-21 DIAGNOSIS — Z96641 Presence of right artificial hip joint: Secondary | ICD-10-CM | POA: Diagnosis not present

## 2020-12-21 DIAGNOSIS — Z888 Allergy status to other drugs, medicaments and biological substances status: Secondary | ICD-10-CM

## 2020-12-21 DIAGNOSIS — K219 Gastro-esophageal reflux disease without esophagitis: Secondary | ICD-10-CM | POA: Diagnosis not present

## 2020-12-21 DIAGNOSIS — G8918 Other acute postprocedural pain: Secondary | ICD-10-CM | POA: Diagnosis not present

## 2020-12-21 DIAGNOSIS — Z881 Allergy status to other antibiotic agents status: Secondary | ICD-10-CM | POA: Diagnosis not present

## 2020-12-21 DIAGNOSIS — R110 Nausea: Secondary | ICD-10-CM | POA: Diagnosis not present

## 2020-12-21 DIAGNOSIS — M1711 Unilateral primary osteoarthritis, right knee: Principal | ICD-10-CM | POA: Diagnosis present

## 2020-12-21 HISTORY — PX: TOTAL KNEE ARTHROPLASTY: SHX125

## 2020-12-21 LAB — TYPE AND SCREEN
ABO/RH(D): A POS
Antibody Screen: NEGATIVE

## 2020-12-21 LAB — ABO/RH: ABO/RH(D): A POS

## 2020-12-21 SURGERY — ARTHROPLASTY, KNEE, TOTAL
Anesthesia: Regional | Site: Knee | Laterality: Right

## 2020-12-21 MED ORDER — MIDAZOLAM HCL 2 MG/2ML IJ SOLN
INTRAMUSCULAR | Status: AC
  Filled 2020-12-21: qty 2

## 2020-12-21 MED ORDER — POLYVINYL ALCOHOL 1.4 % OP SOLN
1.0000 [drp] | Freq: Every day | OPHTHALMIC | Status: DC | PRN
  Filled 2020-12-21: qty 15

## 2020-12-21 MED ORDER — SODIUM CHLORIDE 0.9 % IV SOLN: INTRAVENOUS | Status: DC

## 2020-12-21 MED ORDER — ALUM & MAG HYDROXIDE-SIMETH 200-200-20 MG/5ML PO SUSP: 30.0000 mL | ORAL | Status: DC | PRN

## 2020-12-21 MED ORDER — DOCUSATE SODIUM 100 MG PO CAPS
100.0000 mg | ORAL_CAPSULE | Freq: Two times a day (BID) | ORAL | Status: DC
  Administered 2020-12-21 – 2020-12-25 (×9): 100 mg via ORAL
  Filled 2020-12-21 (×9): qty 1

## 2020-12-21 MED ORDER — VITAMIN D 25 MCG (1000 UNIT) PO TABS
1000.0000 [IU] | ORAL_TABLET | Freq: Every day | ORAL | Status: DC
  Administered 2020-12-21 – 2020-12-25 (×5): 1000 [IU] via ORAL
  Filled 2020-12-21 (×5): qty 1

## 2020-12-21 MED ORDER — ACETAMINOPHEN 500 MG PO TABS
1000.0000 mg | ORAL_TABLET | Freq: Once | ORAL | Status: AC
  Administered 2020-12-21: 1000 mg via ORAL
  Filled 2020-12-21: qty 2

## 2020-12-21 MED ORDER — MENTHOL 3 MG MT LOZG: 1.0000 | LOZENGE | OROMUCOSAL | Status: DC | PRN

## 2020-12-21 MED ORDER — METOCLOPRAMIDE HCL 5 MG/ML IJ SOLN: 5.0000 mg | Freq: Three times a day (TID) | INTRAMUSCULAR | Status: DC | PRN

## 2020-12-21 MED ORDER — CEFAZOLIN SODIUM-DEXTROSE 1-4 GM/50ML-% IV SOLN
1.0000 g | Freq: Once | INTRAVENOUS | Status: DC
  Filled 2020-12-21: qty 50

## 2020-12-21 MED ORDER — LACTATED RINGERS IV SOLN: INTRAVENOUS | Status: DC

## 2020-12-21 MED ORDER — PHENYLEPHRINE HCL (PRESSORS) 10 MG/ML IV SOLN
INTRAVENOUS | Status: DC | PRN
  Administered 2020-12-21: 80 ug via INTRAVENOUS

## 2020-12-21 MED ORDER — FENTANYL CITRATE (PF) 100 MCG/2ML IJ SOLN
INTRAMUSCULAR | Status: AC
  Filled 2020-12-21: qty 2

## 2020-12-21 MED ORDER — CHLORHEXIDINE GLUCONATE 0.12 % MT SOLN
15.0000 mL | Freq: Once | OROMUCOSAL | Status: AC
  Administered 2020-12-21: 15 mL via OROMUCOSAL

## 2020-12-21 MED ORDER — ONDANSETRON HCL 4 MG PO TABS: 4.0000 mg | ORAL_TABLET | Freq: Four times a day (QID) | ORAL | Status: DC | PRN

## 2020-12-21 MED ORDER — HYDROMORPHONE HCL 1 MG/ML IJ SOLN
0.5000 mg | INTRAMUSCULAR | Status: DC | PRN
  Administered 2020-12-21 – 2020-12-22 (×2): 1 mg via INTRAVENOUS
  Filled 2020-12-21 (×2): qty 1

## 2020-12-21 MED ORDER — CEFAZOLIN SODIUM-DEXTROSE 1-4 GM/50ML-% IV SOLN
1.0000 g | Freq: Four times a day (QID) | INTRAVENOUS | Status: DC
  Filled 2020-12-21: qty 50

## 2020-12-21 MED ORDER — MONTELUKAST SODIUM 10 MG PO TABS
10.0000 mg | ORAL_TABLET | Freq: Every day | ORAL | Status: DC
  Administered 2020-12-21 – 2020-12-24 (×4): 10 mg via ORAL
  Filled 2020-12-21 (×4): qty 1

## 2020-12-21 MED ORDER — TRANEXAMIC ACID-NACL 1000-0.7 MG/100ML-% IV SOLN
1000.0000 mg | INTRAVENOUS | Status: AC
  Administered 2020-12-21: 1000 mg via INTRAVENOUS
  Filled 2020-12-21: qty 100

## 2020-12-21 MED ORDER — DEXAMETHASONE SODIUM PHOSPHATE 10 MG/ML IJ SOLN
INTRAMUSCULAR | Status: DC | PRN
  Administered 2020-12-21: 10 mg via INTRAVENOUS

## 2020-12-21 MED ORDER — ORAL CARE MOUTH RINSE: 15.0000 mL | Freq: Once | OROMUCOSAL | Status: AC

## 2020-12-21 MED ORDER — PROPOFOL 500 MG/50ML IV EMUL
INTRAVENOUS | Status: DC | PRN
  Administered 2020-12-21: 75 ug/kg/min via INTRAVENOUS

## 2020-12-21 MED ORDER — AMISULPRIDE (ANTIEMETIC) 5 MG/2ML IV SOLN: 10.0000 mg | Freq: Once | INTRAVENOUS | Status: DC | PRN

## 2020-12-21 MED ORDER — OXYCODONE HCL 5 MG PO TABS: 5.0000 mg | ORAL_TABLET | ORAL | Status: DC | PRN

## 2020-12-21 MED ORDER — CEFAZOLIN SODIUM-DEXTROSE 2-4 GM/100ML-% IV SOLN
2.0000 g | INTRAVENOUS | Status: AC
  Administered 2020-12-21: 2 g via INTRAVENOUS
  Filled 2020-12-21: qty 100

## 2020-12-21 MED ORDER — FENTANYL CITRATE (PF) 100 MCG/2ML IJ SOLN
INTRAMUSCULAR | Status: DC | PRN
  Administered 2020-12-21: 100 ug via INTRAVENOUS

## 2020-12-21 MED ORDER — POVIDONE-IODINE 10 % EX SWAB
2.0000 "application " | Freq: Once | CUTANEOUS | Status: AC
  Administered 2020-12-21: 2 via TOPICAL

## 2020-12-21 MED ORDER — POLYETHYLENE GLYCOL 3350 17 G PO PACK: 17.0000 g | PACK | Freq: Every day | ORAL | Status: DC | PRN

## 2020-12-21 MED ORDER — PROMETHAZINE HCL 25 MG/ML IJ SOLN: 6.2500 mg | INTRAMUSCULAR | Status: DC | PRN

## 2020-12-21 MED ORDER — ACETAMINOPHEN 325 MG PO TABS
325.0000 mg | ORAL_TABLET | Freq: Four times a day (QID) | ORAL | Status: DC | PRN
  Administered 2020-12-22 – 2020-12-24 (×7): 650 mg via ORAL
  Filled 2020-12-21 (×8): qty 2

## 2020-12-21 MED ORDER — CEFAZOLIN SODIUM-DEXTROSE 1-4 GM/50ML-% IV SOLN: 1.0000 g | Freq: Once | INTRAVENOUS | Status: DC

## 2020-12-21 MED ORDER — ASPIRIN 81 MG PO CHEW
81.0000 mg | CHEWABLE_TABLET | Freq: Two times a day (BID) | ORAL | Status: DC
  Administered 2020-12-21 – 2020-12-25 (×8): 81 mg via ORAL
  Filled 2020-12-21 (×8): qty 1

## 2020-12-21 MED ORDER — 0.9 % SODIUM CHLORIDE (POUR BTL) OPTIME
TOPICAL | Status: DC | PRN
  Administered 2020-12-21: 1000 mL

## 2020-12-21 MED ORDER — ALBUTEROL SULFATE HFA 108 (90 BASE) MCG/ACT IN AERS: 2.0000 | INHALATION_SPRAY | Freq: Four times a day (QID) | RESPIRATORY_TRACT | Status: DC | PRN

## 2020-12-21 MED ORDER — SODIUM CHLORIDE 0.9 % IV SOLN
1.0000 g | Freq: Once | INTRAVENOUS | Status: AC
  Administered 2020-12-21: 1 g via INTRAVENOUS
  Filled 2020-12-21: qty 1

## 2020-12-21 MED ORDER — BUPIVACAINE-EPINEPHRINE (PF) 0.5% -1:200000 IJ SOLN
INTRAMUSCULAR | Status: DC | PRN
  Administered 2020-12-21: 30 mL via PERINEURAL

## 2020-12-21 MED ORDER — BUPIVACAINE IN DEXTROSE 0.75-8.25 % IT SOLN
INTRATHECAL | Status: DC | PRN
  Administered 2020-12-21: 1.6 mL via INTRATHECAL

## 2020-12-21 MED ORDER — MIDAZOLAM HCL 5 MG/5ML IJ SOLN
INTRAMUSCULAR | Status: DC | PRN
  Administered 2020-12-21: 2 mg via INTRAVENOUS

## 2020-12-21 MED ORDER — PANTOPRAZOLE SODIUM 40 MG PO TBEC
40.0000 mg | DELAYED_RELEASE_TABLET | Freq: Every day | ORAL | Status: DC
  Administered 2020-12-22 – 2020-12-25 (×4): 40 mg via ORAL
  Filled 2020-12-21 (×5): qty 1

## 2020-12-21 MED ORDER — OXYCODONE HCL 5 MG PO TABS
5.0000 mg | ORAL_TABLET | Freq: Once | ORAL | Status: DC | PRN
Start: 2020-12-21 — End: 2020-12-21

## 2020-12-21 MED ORDER — PHENOL 1.4 % MT LIQD
1.0000 | OROMUCOSAL | Status: DC | PRN
Start: 2020-12-21 — End: 2020-12-25

## 2020-12-21 MED ORDER — DIPHENHYDRAMINE HCL 12.5 MG/5ML PO ELIX: 12.5000 mg | ORAL_SOLUTION | ORAL | Status: DC | PRN

## 2020-12-21 MED ORDER — ONDANSETRON HCL 4 MG/2ML IJ SOLN
4.0000 mg | Freq: Four times a day (QID) | INTRAMUSCULAR | Status: DC | PRN
  Administered 2020-12-22 (×2): 4 mg via INTRAVENOUS
  Filled 2020-12-21 (×2): qty 2

## 2020-12-21 MED ORDER — PROPOFOL 1000 MG/100ML IV EMUL
INTRAVENOUS | Status: AC
  Filled 2020-12-21: qty 100

## 2020-12-21 MED ORDER — FENTANYL CITRATE (PF) 100 MCG/2ML IJ SOLN: 25.0000 ug | INTRAMUSCULAR | Status: DC | PRN

## 2020-12-21 MED ORDER — BUPIVACAINE-EPINEPHRINE 0.25% -1:200000 IJ SOLN
INTRAMUSCULAR | Status: DC | PRN
  Administered 2020-12-21: 30 mL

## 2020-12-21 MED ORDER — METOCLOPRAMIDE HCL 5 MG PO TABS
5.0000 mg | ORAL_TABLET | Freq: Three times a day (TID) | ORAL | Status: DC | PRN
Start: 2020-12-21 — End: 2020-12-25

## 2020-12-21 MED ORDER — SODIUM CHLORIDE 0.9 % IR SOLN
Status: DC | PRN
Start: 2020-12-21 — End: 2020-12-21
  Administered 2020-12-21: 1000 mL

## 2020-12-21 MED ORDER — OXYCODONE HCL 5 MG PO TABS
10.0000 mg | ORAL_TABLET | ORAL | Status: DC | PRN
  Administered 2020-12-21 (×2): 15 mg via ORAL
  Administered 2020-12-21: 10 mg via ORAL
  Administered 2020-12-22 – 2020-12-24 (×12): 15 mg via ORAL
  Administered 2020-12-24: 10 mg via ORAL
  Administered 2020-12-24 – 2020-12-25 (×2): 15 mg via ORAL
  Administered 2020-12-25: 10 mg via ORAL
  Filled 2020-12-21: qty 2
  Filled 2020-12-21 (×2): qty 3
  Filled 2020-12-21: qty 2
  Filled 2020-12-21 (×8): qty 3
  Filled 2020-12-21: qty 2
  Filled 2020-12-21 (×6): qty 3

## 2020-12-21 MED ORDER — FENOFIBRATE 160 MG PO TABS
160.0000 mg | ORAL_TABLET | Freq: Every evening | ORAL | Status: DC
  Administered 2020-12-22 – 2020-12-24 (×3): 160 mg via ORAL
  Filled 2020-12-21 (×3): qty 1

## 2020-12-21 MED ORDER — BUPIVACAINE-EPINEPHRINE (PF) 0.25% -1:200000 IJ SOLN
INTRAMUSCULAR | Status: AC
  Filled 2020-12-21: qty 30

## 2020-12-21 MED ORDER — METHOCARBAMOL 500 MG IVPB - SIMPLE MED
500.0000 mg | Freq: Four times a day (QID) | INTRAVENOUS | Status: DC | PRN
  Filled 2020-12-21: qty 50

## 2020-12-21 MED ORDER — OXYCODONE HCL 5 MG/5ML PO SOLN: 5.0000 mg | Freq: Once | ORAL | Status: DC | PRN

## 2020-12-21 MED ORDER — ONDANSETRON HCL 4 MG/2ML IJ SOLN
INTRAMUSCULAR | Status: DC | PRN
  Administered 2020-12-21: 4 mg via INTRAVENOUS

## 2020-12-21 MED ORDER — METHOCARBAMOL 500 MG PO TABS
500.0000 mg | ORAL_TABLET | Freq: Four times a day (QID) | ORAL | Status: DC | PRN
  Administered 2020-12-21 – 2020-12-25 (×13): 500 mg via ORAL
  Filled 2020-12-21 (×13): qty 1

## 2020-12-21 SURGICAL SUPPLY — 63 items
APL SKNCLS STERI-STRIP NONHPOA (GAUZE/BANDAGES/DRESSINGS) ×1
BAG SPEC THK2 15X12 ZIP CLS (MISCELLANEOUS) ×1
BAG ZIPLOCK 12X15 (MISCELLANEOUS) ×2 IMPLANT
BENZOIN TINCTURE PRP APPL 2/3 (GAUZE/BANDAGES/DRESSINGS) ×1 IMPLANT
BLADE SAG 18X100X1.27 (BLADE) ×2 IMPLANT
BLADE SURG SZ10 CARB STEEL (BLADE) ×4 IMPLANT
BNDG ELASTIC 6X5.8 VLCR STR LF (GAUZE/BANDAGES/DRESSINGS) ×4 IMPLANT
BOWL SMART MIX CTS (DISPOSABLE) ×1 IMPLANT
BSPLAT TIB 5D E CMNT KN RT (Knees) ×1 IMPLANT
CEMENT BONE R 1X40 (Cement) ×2 IMPLANT
COOLER ICEMAN CLASSIC (MISCELLANEOUS) ×2 IMPLANT
COVER SURGICAL LIGHT HANDLE (MISCELLANEOUS) ×2 IMPLANT
COVER WAND RF STERILE (DRAPES) IMPLANT
CUFF TOURN SGL QUICK 34 (TOURNIQUET CUFF) ×2
CUFF TRNQT CYL 34X4.125X (TOURNIQUET CUFF) ×1 IMPLANT
DECANTER SPIKE VIAL GLASS SM (MISCELLANEOUS) IMPLANT
DRAPE U-SHAPE 47X51 STRL (DRAPES) ×2 IMPLANT
DRSG PAD ABDOMINAL 8X10 ST (GAUZE/BANDAGES/DRESSINGS) ×4 IMPLANT
DURAPREP 26ML APPLICATOR (WOUND CARE) ×2 IMPLANT
ELECT BLADE TIP CTD 4 INCH (ELECTRODE) ×2 IMPLANT
ELECT REM PT RETURN 15FT ADLT (MISCELLANEOUS) ×2 IMPLANT
GAUZE SPONGE 4X4 12PLY STRL (GAUZE/BANDAGES/DRESSINGS) ×2 IMPLANT
GAUZE XEROFORM 1X8 LF (GAUZE/BANDAGES/DRESSINGS) ×1 IMPLANT
GLOVE SRG 8 PF TXTR STRL LF DI (GLOVE) ×2 IMPLANT
GLOVE SURG ENC MOIS LTX SZ7.5 (GLOVE) ×2 IMPLANT
GLOVE SURG LTX SZ8 (GLOVE) ×2 IMPLANT
GLOVE SURG UNDER POLY LF SZ8 (GLOVE) ×4
GOWN STRL REUS W/TWL XL LVL3 (GOWN DISPOSABLE) ×4 IMPLANT
HANDPIECE INTERPULSE COAX TIP (DISPOSABLE) ×2
HDLS TROCR DRIL PIN KNEE 75 (PIN) ×2
HOLDER FOLEY CATH W/STRAP (MISCELLANEOUS) ×1 IMPLANT
IMMOBILIZER KNEE 20 (SOFTGOODS) ×2
IMMOBILIZER KNEE 20 THIGH 36 (SOFTGOODS) ×1 IMPLANT
IMPL FEM CEMT CR STD SZ 9 RT (Joint) ×1 IMPLANT
INSERT TIB AS PERS 8-11X13 (Insert) ×1 IMPLANT
KIT TURNOVER KIT A (KITS) ×2 IMPLANT
NS IRRIG 1000ML POUR BTL (IV SOLUTION) ×2 IMPLANT
PACK TOTAL KNEE CUSTOM (KITS) ×2 IMPLANT
PAD COLD SHLDR WRAP-ON (PAD) ×2 IMPLANT
PADDING CAST COTTON 6X4 STRL (CAST SUPPLIES) ×4 IMPLANT
PENCIL SMOKE EVACUATOR (MISCELLANEOUS) IMPLANT
PERSONA 2.5MM FEMALE HEX SCREW 25MM LENGTH (Orthopedic Implant) ×1 IMPLANT
PIN DRILL HDLS TROCAR 75 4PK (PIN) IMPLANT
PIN FLUTED HEDLESS FIX 3.5X1/8 (PIN) ×1 IMPLANT
PROTECTOR NERVE ULNAR (MISCELLANEOUS) ×2 IMPLANT
SCREW FEMALE HEX FIX 25X2.5 (ORTHOPEDIC DISPOSABLE SUPPLIES) ×1 IMPLANT
SET HNDPC FAN SPRY TIP SCT (DISPOSABLE) ×1 IMPLANT
SET PAD KNEE POSITIONER (MISCELLANEOUS) ×2 IMPLANT
STAPLER VISISTAT 35W (STAPLE) ×1 IMPLANT
STEM POLY PAT PLY 35M KNEE (Knees) ×1 IMPLANT
STEM TIBIA 5 DEG SZ E R KNEE (Knees) IMPLANT
STRIP CLOSURE SKIN 1/2X4 (GAUZE/BANDAGES/DRESSINGS) ×2 IMPLANT
SUT MNCRL AB 4-0 PS2 18 (SUTURE) IMPLANT
SUT VIC AB 0 CT1 27 (SUTURE) ×2
SUT VIC AB 0 CT1 27XBRD ANTBC (SUTURE) ×1 IMPLANT
SUT VIC AB 1 CT1 36 (SUTURE) ×4 IMPLANT
SUT VIC AB 2-0 CT1 27 (SUTURE) ×4
SUT VIC AB 2-0 CT1 TAPERPNT 27 (SUTURE) ×2 IMPLANT
TIBIA STEM 5 DEG SZ E R KNEE (Knees) ×2 IMPLANT
TRAY FOLEY MTR SLVR 14FR STAT (SET/KITS/TRAYS/PACK) ×1 IMPLANT
TRAY FOLEY MTR SLVR 16FR STAT (SET/KITS/TRAYS/PACK) IMPLANT
TUBE SUCTION HIGH CAP CLEAR NV (SUCTIONS) ×1 IMPLANT
WATER STERILE IRR 1000ML POUR (IV SOLUTION) ×4 IMPLANT

## 2020-12-21 NOTE — Anesthesia Postprocedure Evaluation (Signed)
Anesthesia Post Note  Patient: [name]  Procedure(s) Performed: RIGHT TOTAL KNEE ARTHROPLASTY (Right Knee)     Patient location during evaluation: PACU Anesthesia Type: Regional and Spinal Level of consciousness: awake Pain management: pain level controlled Vital Signs Assessment: post-procedure vital signs reviewed and stable Respiratory status: spontaneous breathing, respiratory function stable and patient connected to nasal cannula oxygen Cardiovascular status: blood pressure returned to baseline and stable Postop Assessment: no headache, no backache and no apparent nausea or vomiting Anesthetic complications: no   No complications documented.  Last Vitals:  Vitals:   12/21/20 1054 12/21/20 1259  BP: (!) 148/91 (!) 155/93  Pulse: 80 83  Resp: 18 20  Temp: 36.4 C 36.4 C  SpO2: 99% 97%    Last Pain:  Vitals:   12/21/20 1635  TempSrc:   PainSc: 8                  Jimie Kuwahara P Derrico Zhong

## 2020-12-21 NOTE — Interval H&P Note (Signed)
History and Physical Interval Note: The patient understands that she is here today for a right total knee replacement to treat her right knee osteoarthritis.  There has been no acute or interval change in her medical status.  See recent H&P.  The risks and benefits of surgery been discussed and explained in detail.  Informed consent is obtained.  The right operative knee has been marked.  12/21/2020 6:48 AM  [name]  has presented today for surgery, with the diagnosis of Osteoarthritis Right Knee.  The various methods of treatment have been discussed with the patient and family. After consideration of risks, benefits and other options for treatment, the patient has consented to  Procedure(s): RIGHT TOTAL KNEE ARTHROPLASTY (Right) as a surgical intervention.  The patient's history has been reviewed, patient examined, no change in status, stable for surgery.  I have reviewed the patient's chart and labs.  Questions were answered to the patient's satisfaction.     Mcarthur Rossetti

## 2020-12-21 NOTE — Plan of Care (Signed)
Plan of care reviewed and discussed with the patient. 

## 2020-12-21 NOTE — Evaluation (Signed)
Physical Therapy Evaluation Patient Details Name: [name] MRN: [id number] DOB: [birth year] Today's Date: 12/21/2020   History of Present Illness  Patient is 60 y.o. female s/p Rt TKA on 12/21/20 HTN, HLD, asthma, OA, GERD.  Clinical Impression  RUQAYYAH LUTE is a 60 y.o. female [name] s/p Rt TKA. Patient reports independence with mobility at baseline. Patient is now limited by functional impairments (see PT problem list below) and requires min assist for transfers and gait with RW. Patient was able to ambulate ~10 feet with RW and min assist. Distance limited due to c/o overheated feeling. Immobilizer used for Rt LE 2/2 quad weakness and pain. Patient instructed in exercise to facilitate circulation to manage edema and reduce risk of DVT. Patient will benefit from continued skilled PT interventions to address impairments and progress towards PLOF. Acute PT will follow to progress mobility and stair training in preparation for safe discharge home.     Follow Up Recommendations Follow surgeon's recommendation for DC plan and follow-up therapies;Outpatient PT    Equipment Recommendations  None recommended by PT    Recommendations for Other Services       Precautions / Restrictions Precautions Precautions: Fall Restrictions Weight Bearing Restrictions: No Other Position/Activity Restrictions: WBAT      Mobility  Bed Mobility Overal bed mobility: Needs Assistance Bed Mobility: Supine to Sit     Supine to sit: Min assist;HOB elevated     General bed mobility comments: Cues to use bed rail and sequence, assist for Rt LE to bring to EOB.    Transfers Overall transfer level: Needs assistance Equipment used: Rolling walker (2 wheeled) Transfers: Sit to/from Stand Sit to Stand: Min assist;From elevated surface         General transfer comment: Cues for hand placement with power up, assist to complete rise. pt steady once standing.  Ambulation/Gait Ambulation/Gait assistance:  Min assist Gait Distance (Feet): 10 Feet Assistive device: Rolling walker (2 wheeled) Gait Pattern/deviations: Step-to pattern;Decreased stride length;Decreased weight shift to right Gait velocity: decr   General Gait Details: cues for step to pattern and proximity to RW, assist for walker positioning. pt c/o overheated sensation and seated rest provided.  Stairs            Wheelchair Mobility    Modified Rankin (Stroke Patients Only)       Balance Overall balance assessment: Needs assistance Sitting-balance support: Feet supported Sitting balance-Leahy Scale: Good     Standing balance support: During functional activity;Bilateral upper extremity supported Standing balance-Leahy Scale: Poor                               Pertinent Vitals/Pain Pain Assessment: Kane-10 Pain Score: 6  Pain Location: Rt knee Pain Descriptors / Indicators: Aching;Discomfort;Sore Pain Intervention(s): Limited activity within patient's tolerance;Monitored during session;Repositioned;Ice applied;Premedicated before session    Home Living Family/patient expects to be discharged to:: Private residence Living Arrangements: Spouse/significant other Available Help at Discharge: Family Type of Home: House Home Access: Stairs to enter Entrance Stairs-Rails: Right Entrance Stairs-Number of Steps: 2 Home Layout: Two level;Full bath on main level;Able to live on main level with bedroom/bathroom Home Equipment: Shower seat;Toilet riser;Walker - 2 wheels;Cane - single point      Prior Function Level of Independence: Independent               Hand Dominance        Extremity/Trunk Assessment   Upper Extremity  Assessment Upper Extremity Assessment: Overall WFL for tasks assessed    Lower Extremity Assessment Lower Extremity Assessment: RLE deficits/detail RLE Deficits / Details: able to complete quad set but unable to complete SLR secondary to pain. RLE: Unable to fully  assess due to pain;Unable to fully assess due to immobilization RLE Sensation: WNL RLE Coordination: WNL    Cervical / Trunk Assessment Cervical / Trunk Assessment: Normal  Communication   Communication: No difficulties  Cognition Arousal/Alertness: Awake/alert Behavior During Therapy: WFL for tasks assessed/performed Overall Cognitive Status: Within Functional Limits for tasks assessed                                        General Comments      Exercises Total Joint Exercises Ankle Circles/Pumps: AROM;Both;20 reps;Seated   Assessment/Plan    PT Assessment Patient needs continued PT services  PT Problem List Decreased strength;Decreased range of motion;Decreased activity tolerance;Decreased balance;Decreased mobility;Decreased knowledge of use of DME;Decreased safety awareness;Decreased knowledge of precautions;Pain       PT Treatment Interventions DME instruction;Gait training;Stair training;Therapeutic activities;Functional mobility training;Therapeutic exercise;Balance training;Patient/family education    PT Goals (Current goals can be found in the Care Plan section)  Acute Rehab PT Goals Patient Stated Goal: get back to hiking and walking her golden retriever PT Goal Formulation: With patient Time For Goal Achievement: 12/28/20 Potential to Achieve Goals: Good    Frequency 7X/week   Barriers to discharge        Co-evaluation               AM-PAC PT "6 Clicks" Mobility  Outcome Measure Help needed turning from your back to your side while in a flat bed without using bedrails?: A Little Help needed moving from lying on your back to sitting on the side of a flat bed without using bedrails?: A Little Help needed moving to and from a bed to a chair (including a wheelchair)?: A Little Help needed standing up from a chair using your arms (e.g., wheelchair or bedside chair)?: A Little Help needed to walk in hospital room?: A Little Help needed  climbing 3-5 steps with a railing? : A Lot 6 Click Score: 17    End of Session Equipment Utilized During Treatment: Gait belt;Right knee immobilizer Activity Tolerance: Patient tolerated treatment well Patient left: in chair;with call bell/phone within reach;with chair alarm set;with family/visitor present Nurse Communication: Mobility status PT Visit Diagnosis: Muscle weakness (generalized) (M62.81);Difficulty in walking, not elsewhere classified (R26.2);Pain Pain - Right/Left: Right Pain - part of body: Knee    Time: 9833-8250 PT Time Calculation (min) (ACUTE ONLY): 23 min   Charges:   PT Evaluation $PT Eval Low Complexity: 1 Low PT Treatments $Gait Training: 8-22 mins        Verner Mould, DPT Acute Rehabilitation Services Office [phone] Pager [phone]    Jacques Navy 12/21/2020, 2:09 PM

## 2020-12-21 NOTE — Op Note (Signed)
NAMECAYLEIGH, [name] MEDICAL RECORD NO: [id number] ACCOUNT NO: [id number] DATE OF BIRTH: November 07, 1960 FACILITY: [facility] LOCATION: [facility] PHYSICIAN: Lind Guest. Ninfa Linden, MD  Operative Report   DATE OF PROCEDURE: 12/21/2020  PREOPERATIVE DIAGNOSES:  Primary osteoarthritis and degenerative joint disease, right knee.  POSTOPERATIVE DIAGNOSES:  Primary osteoarthritis and degenerative joint disease, right knee.  PROCEDURE:  Right total knee arthroplasty.  IMPLANTS:  Zimmer Persona CR cemented knee with size 9 femur, size E tibial tray, 13 mm MC bearing polyethylene insert, size 35 patellar button.  SURGEON:  Lind Guest. Ninfa Linden, MD  ASSISTANT:  Erskine Emery, PA-C  ANESTHESIA: 1.  Right lower extremity adductor canal block. 2.  Spinal. 3.  Local with Marcaine with epinephrine around the arthrotomy.  ANTIBIOTICS:  2 grams IV Ancef.  TOURNIQUET TIME:  Just over 1 hour.  BLOOD LOSS:  Less than 263 mL  COMPLICATIONS:  None.  INDICATIONS:  The patient is a 60 year old female well known to me.  She has severe end-stage arthritis of her right knee that has been well documented.  At this point, she wished to proceed with a total knee arthroplasty given the failure of  conservative treatment and given her daily pain that is detrimentally affecting her mobility, her quality of life and activities of daily living.  Given her x-ray findings and her clinical exam findings, we have recommended total knee as well.  We have  tried conservative treatment for a long period of time.  She understands the risk of acute blood loss anemia, nerve and vessel injury, fracture, infection, DVT, and implant failure and skin and soft tissue issues.  We talked about our goals being  decrease pain, improve mobility and overall improve quality of life.  DESCRIPTION OF PROCEDURE:  After informed consent was obtained, appropriate right knee was marked.  An adductor canal block was obtained in the right lower  extremity in the holding room.  She was then brought to the operating room and sat up on the  operating table where spinal anesthesia was obtained. She was laid in supine position on the operating table.  Foley catheter was placed and a nonsterile tourniquet was placed around her upper right thigh.  Her right thigh, knee, leg, ankle and foot were  prepped and draped with DuraPrep and sterile drapes including a sterile stockinette.  Timeout was called.  She was identified as correct patient, correct right knee.  We then used the Esmarch to wrap out the leg and tourniquet was inflated to 300 mm of  pressure.  I then made a direct midline incision over the patella and carried this proximally and distally.  We dissected down the knee joint and carried out a medial parapatellar arthrotomy, finding a very large joint effusion and significant cartilage  wear on the medial aspect of her knee.  There were osteophytes in all three compartments.  We removed periarticular osteophytes from around the knee as well as remnants of ACL, PCL, medial and lateral meniscus.  With the knee in a flexed position, we  used our extramedullary cutting guide, correcting for varus and valgus for making our proximal tibia cut, taking 2 mm off the low side.  We corrected varus and valgus and a slope of 3 degrees.  We made this cut without difficulty.  We then used the  intramedullary guide for making our distal femoral cut, setting this for a right knee at 5 degrees externally rotated and a 10 mm distal femoral cut.  We made this cut without  difficulty and brought the knee back down to full extension.  With a 10 mm  extension block, we still lacked just a few degrees of extension, so we ended up taking 2 more mm off the tibia.  We were pleased with the extension after that.  We then went back to the femur and put our femoral sizing guide based off Whiteside's line  and epicondylar axis.  Based off of this, we chose a size 9 femur.  We put  a 4-in-1 cutting block for size 9 femur, made our anterior and posterior cuts followed by our chamfer cuts.  We then went back to the tibia and chose a size E tibial tray for  coverage of her right knee, correcting for rotation off of the tibial tubercle and the femur.  We made our drill hole off of this and our keel punch.  With a size E trial right tibia, we trialed a 9 right femur and then a 10 mm followed by 11 mm  polyethylene insert.  We felt that the real insert should likely be a 13.  She was stable.  We then made our patellar cut and drilled three holes for a size 35 patellar button.  We then removed all instrumentation from the knee and cleaned all the debris  from the knee that we could.  I placed the Marcaine with epinephrine around the arthrotomy.  We then irrigated the knee with normal saline solution using pulsatile lavage.  We dried the knee real well and mixed our cement.  With the knee in a flexed  position, we then cemented our Zimmer Persona tibial tray size E.  We cemented our size 9 right femur.  We then placed our 13 mm MC bearing polyethylene liner.  We cemented our patellar button as well.  We removed excess cement debris from the knee and  held the knee compressed in an extended position while the cement hardened.  Once it hardened, we put the knee through range of motion.  We were pleased with range of motion and stability.  We then let the tourniquet down and hemostasis obtained with  electrocautery.  We closed the arthrotomy with interrupted #1 Vicryl suture followed by 0 Vicryl to close the deep tissue and 2-0 Vicryl to close the subcutaneous tissue.  The skin was reapproximated with staples.  Xeroform well-padded sterile dressing  was applied.  She was taken to recovery room in stable condition with all final counts being correct.  No complications noted.  Of note, Benita Stabile, PA-C, assisted during the entire case and assistance was crucial for facilitating all aspects of this   case.  Of note, THE PATIENT HAS A SEVERE NICKEL ALLERGY, so this is a titanium knee that is placed in her knee due to her nickel allergy.   SHW D: 12/21/2020 8:49:14 am T: 12/21/2020 10:44:00 am  JOB: [id number]/ [id number]

## 2020-12-21 NOTE — Brief Op Note (Signed)
12/21/2020  8:50 AM  PATIENT:  [name]  60 y.o. female  PRE-OPERATIVE DIAGNOSIS:  Osteoarthritis Right Knee  POST-OPERATIVE DIAGNOSIS:  Osteoarthritis Right Knee  PROCEDURE:  Procedure(s): RIGHT TOTAL KNEE ARTHROPLASTY (Right)  SURGEON:  Surgeon(s) and Role:    Mcarthur Rossetti, MD - Primary  PHYSICIAN ASSISTANT:  Benita Stabile, PA-C  ANESTHESIA:   local, regional and spinal  EBL:  25 mL   COUNTS:  YES  TOURNIQUET:   Total Tourniquet Time Documented: Thigh (Right) - 61 minutes Total: Thigh (Right) - 61 minutes   DICTATION: .Other Dictation: Dictation Number [id number]  PLAN OF CARE: Admit for overnight observation  PATIENT DISPOSITION:  PACU - hemodynamically stable.   Delay start of Pharmacological VTE agent (>24hrs) due to surgical blood loss or risk of bleeding: no

## 2020-12-21 NOTE — Anesthesia Procedure Notes (Signed)
Spinal  Patient location during procedure: OR Start time: 12/21/2020 7:15 AM End time: 12/21/2020 7:20 AM Reason for block: surgical anesthesia Staffing Performed: anesthesiologist  Anesthesiologist: Murvin Natal, MD Preanesthetic Checklist Completed: patient identified, IV checked, risks and benefits discussed, surgical consent, monitors and equipment checked, pre-op evaluation and timeout performed Spinal Block Patient position: sitting Prep: DuraPrep Patient monitoring: cardiac monitor, continuous pulse ox and blood pressure Approach: midline Location: L4-5 Injection technique: single-shot Needle Needle type: Pencan  Needle gauge: 24 G Needle length: 9 cm Assessment Sensory level: T10 Events: CSF return Additional Notes Functioning IV was confirmed and monitors were applied. Sterile prep and drape, including hand hygiene and sterile gloves were used. The patient was positioned and the spine was prepped. The skin was anesthetized with lidocaine.  Free flow of clear CSF was obtained prior to injecting local anesthetic into the CSF.  The spinal needle aspirated freely following injection.  The needle was carefully withdrawn.  The patient tolerated the procedure well.

## 2020-12-21 NOTE — Anesthesia Procedure Notes (Signed)
Anesthesia Regional Block: Adductor canal block   Pre-Anesthetic Checklist: ,, timeout performed, Correct Patient, Correct Site, Correct Laterality, Correct Procedure,, site marked, risks and benefits discussed, Surgical consent,  Pre-op evaluation,  At surgeon's request and post-op pain management  Laterality: Right  Prep: chloraprep       Needles:  Injection technique: Single-shot  Needle Type: Echogenic Stimulator Needle     Needle Length: 10cm  Needle Gauge: 20     Additional Needles:   Procedures:,,,, ultrasound used (permanent image in chart),,,,  Narrative:  Start time: 12/21/2020 6:45 AM End time: 12/21/2020 6:55 AM Injection made incrementally with aspirations every 5 mL.  Performed by: Personally  Anesthesiologist: Murvin Natal, MD  Additional Notes: Functioning IV was confirmed and monitors were applied. A time-out was performed. Hand hygiene and sterile gloves were used. The thigh was placed in a frog-leg position and prepped in a sterile fashion. A 151mm 20ga BBraun echogenic stimulator needle was placed using ultrasound guidance.  Negative aspiration and negative test dose prior to incremental administration of local anesthetic. The patient tolerated the procedure well.

## 2020-12-21 NOTE — Transfer of Care (Signed)
Immediate Anesthesia Transfer of Care Note  Patient: [name]  Procedure(s) Performed: RIGHT TOTAL KNEE ARTHROPLASTY (Right Knee)  Patient Location: PACU  Anesthesia Type:Spinal  Level of Consciousness: sedated, patient cooperative and responds to stimulation  Airway & Oxygen Therapy: Patient Spontanous Breathing and Patient connected to face mask oxygen  Post-op Assessment: Report given to RN and Post -op Vital signs reviewed and stable  Post vital signs: Reviewed and stable  Last Vitals:  Vitals Value Taken Time  BP 108/76 12/21/20 0920  Temp    Pulse 81 12/21/20 0921  Resp 14 12/21/20 0921  SpO2 99 % 12/21/20 0921  Vitals shown include unvalidated device data.  Last Pain:  Vitals:   12/21/20 0548  TempSrc:   PainSc: 0-No pain      Patients Stated Pain Goal: 5 (01/77/93 9030)  Complications: No complications documented.

## 2020-12-22 LAB — BASIC METABOLIC PANEL
Anion gap: 7 (ref 5–15)
BUN: 15 mg/dL (ref 8–23)
CO2: 25 mmol/L (ref 22–32)
Calcium: 9 mg/dL (ref 8.9–10.3)
Chloride: 106 mmol/L (ref 98–111)
Creatinine, Ser: 0.69 mg/dL (ref 0.44–1.00)
GFR, Estimated: >60 mL/min (ref >60)
Glucose, Bld: 151 mg/dL — ABNORMAL HIGH (ref 70–99)
Potassium: 3.9 mmol/L (ref 3.5–5.1)
Sodium: 138 mmol/L (ref 135–145)

## 2020-12-22 LAB — CBC
HCT: 37.7 % (ref 36.0–46.0)
Hemoglobin: 12.1 g/dL (ref 12.0–15.0)
MCH: 29.7 pg (ref 26.0–34.0)
MCHC: 32.1 g/dL (ref 30.0–36.0)
MCV: 92.4 fL (ref 80.0–100.0)
Platelets: 365 10*3/uL (ref 150–400)
RBC: 4.08 MIL/uL (ref 3.87–5.11)
RDW: 13.3 % (ref 11.5–15.5)
WBC: 12.5 10*3/uL — ABNORMAL HIGH (ref 4.0–10.5)
nRBC: 0 % (ref 0.0–0.2)

## 2020-12-22 MED ORDER — KETOROLAC TROMETHAMINE 15 MG/ML IJ SOLN
15.0000 mg | Freq: Four times a day (QID) | INTRAMUSCULAR | Status: AC
  Administered 2020-12-22 – 2020-12-23 (×4): 15 mg via INTRAVENOUS
  Filled 2020-12-22 (×4): qty 1

## 2020-12-22 MED ORDER — SODIUM CHLORIDE 0.9 % IV SOLN
12.5000 mg | Freq: Four times a day (QID) | INTRAVENOUS | Status: DC | PRN
  Filled 2020-12-22: qty 0.5

## 2020-12-22 MED ORDER — TRAMADOL HCL 50 MG PO TABS
100.0000 mg | ORAL_TABLET | Freq: Four times a day (QID) | ORAL | Status: DC | PRN
  Administered 2020-12-23 – 2020-12-25 (×6): 100 mg via ORAL
  Filled 2020-12-22 (×6): qty 2

## 2020-12-22 NOTE — Plan of Care (Signed)
  Problem: Coping: Goal: Level of anxiety will decrease 12/22/2020 2209 by Blase Mess, RN Outcome: Progressing 12/22/2020 2209 by Blase Mess, RN Outcome: Progressing   Problem: Pain Managment: Goal: General experience of comfort will improve 12/22/2020 2209 by Blase Mess, RN Outcome: Progressing 12/22/2020 2209 by Blase Mess, RN Outcome: Progressing   Problem: Safety: Goal: Ability to remain free from injury will improve 12/22/2020 2209 by Blase Mess, RN Outcome: Progressing 12/22/2020 2209 by Blase Mess, RN Outcome: Progressing

## 2020-12-22 NOTE — Progress Notes (Signed)
Physical Therapy Treatment Patient Details Name: [name] MRN: [id number] DOB: 11/21/1960 Today's Date: 12/22/2020    History of Present Illness Patient is 60 y.o. female s/p Rt TKA on 12/21/20 HTN, HLD, asthma, OA, GERD.    PT Comments    Pt with progress this afternoon.  Her chest pain had completely resolved with pain meds and hot pack.  She continues to have positional headache - 1/10 when laying flat and severe "splitting like migraine" 7/10 when seated or standing.  Pt reports MD is aware and mentioned spinal headache/blood patch this am.  Educated pt to continue to monitor and notify MD of status. Pt aware positional headache sign of spinal headache.  She did ambulate to from bathroom 15'x2 with min guard but again distance limited to headache that leads to nausea.  Participated with exercises with cues for correct motions.  Pt with minimal quad activation and limited flexion due to pain, but has full knee extension ROM. Continue to progress as able.    Follow Up Recommendations  Follow surgeon's recommendation for DC plan and follow-up therapies     Equipment Recommendations  None recommended by PT    Recommendations for Other Services       Precautions / Restrictions Precautions Precautions: Fall Restrictions Other Position/Activity Restrictions: WBAT    Mobility  Bed Mobility Overal bed mobility: Needs Assistance Bed Mobility: Supine to Sit;Sit to Supine     Supine to sit: Min assist Sit to supine: Min assist   General bed mobility comments: Min A for R leg; use of rails and HOB elevated    Transfers Overall transfer level: Needs assistance Equipment used: Rolling walker (2 wheeled) Transfers: Sit to/from Stand Sit to Stand: Min guard         General transfer comment: Min guard with cues for hand placement; pt able to manage R LE this afternoon with cues  Ambulation/Gait Ambulation/Gait assistance: Min guard Gait Distance (Feet): 15 Feet  (15'x2) Assistive device: Rolling walker (2 wheeled) Gait Pattern/deviations: Step-to pattern;Decreased stride length;Decreased weight shift to right Gait velocity: decreased   General Gait Details: Ambulated to/from bathroom with min guard for safety; utilized knee immobilzer as pt unable to SLR; distance limited due to severe headache leading to nausea in standing   Stairs             Wheelchair Mobility    Modified Rankin (Stroke Patients Only)       Balance Overall balance assessment: Needs assistance Sitting-balance support: Feet supported Sitting balance-Leahy Scale: Good     Standing balance support: During functional activity;Bilateral upper extremity supported;No upper extremity supported Standing balance-Leahy Scale: Fair Standing balance comment: RW for ambulation but able to wash hands at sink without support                            Cognition Arousal/Alertness: Awake/alert Behavior During Therapy: WFL for tasks assessed/performed Overall Cognitive Status: Within Functional Limits for tasks assessed                                        Exercises Total Joint Exercises Ankle Circles/Pumps: AROM;Both;10 reps;Supine Quad Sets: AROM;Both;Supine;10 reps Towel Squeeze: AROM;Supine;Both;10 reps Short Arc QuadSinclair Ship;Right;10 reps;Seated Hip ABduction/ADduction: AAROM;Right;10 reps;Supine Knee Flexion: AAROM;Right;10 reps;Seated Goniometric ROM: R knee 0 to 30 degrees limited by pain Other Exercises Other Exercises: minimal quad  activation requiring AAROM ; ROM limited by pain    General Comments General comments (skin integrity, edema, etc.): Since am pt had received Tordol and had k-pad on back.  Chest pain completely resolved.  She still reports headache that is only 1/10 when laying flat and severe/throbbing 7/10 when sitting or walking, resolves immediately upon return to supine (pain somewhat improved compared to am , but  also received further medication).  Pt reports MD mentioned spinal headache and possible blood patch this morning.  Pt is aware that positional headache could be symptom of spinal leak and aware to monitor symptoms and notify MD/RN of symptoms.  Pt also with red flushed cheeks - reports that is not her baseline.      Pertinent Vitals/Pain Pain Assessment: 0-10 Pain Score: 7  Pain Location: 7/10 knee with activity; 7/10 headache when sitting standing but 1/10 when flat; Chest pain resolved Pain Descriptors / Indicators: Aching;Discomfort;Sore;Throbbing Pain Intervention(s): Limited activity within patient's tolerance;Monitored during session;Repositioned;Ice applied;Patient requesting pain meds-RN notified    Home Living                      Prior Function            PT Goals (current goals can now be found in the care plan section) Acute Rehab PT Goals Patient Stated Goal: get back to hiking and walking her golden retriever PT Goal Formulation: With patient Time For Goal Achievement: 12/28/20 Potential to Achieve Goals: Good Progress towards PT goals: Progressing toward goals    Frequency    7X/week      PT Plan Current plan remains appropriate    Co-evaluation              AM-PAC PT "6 Clicks" Mobility   Outcome Measure  Help needed turning from your back to your side while in a flat bed without using bedrails?: A Little Help needed moving from lying on your back to sitting on the side of a flat bed without using bedrails?: A Little Help needed moving to and from a bed to a chair (including a wheelchair)?: A Little Help needed standing up from a chair using your arms (e.g., wheelchair or bedside chair)?: A Little Help needed to walk in hospital room?: A Little Help needed climbing 3-5 steps with a railing? : A Lot 6 Click Score: 17    End of Session Equipment Utilized During Treatment: Gait belt;Right knee immobilizer Activity Tolerance: Treatment  limited secondary to medical complications (Comment) (see general comments) Patient left: in bed;with call bell/phone within reach;with bed alarm set Nurse Communication: Mobility status PT Visit Diagnosis: Muscle weakness (generalized) (M62.81);Difficulty in walking, not elsewhere classified (R26.2);Pain Pain - Right/Left: Right Pain - part of body: Knee     Time: 3267-1245 PT Time Calculation (min) (ACUTE ONLY): 30 min  Charges:  $Gait Training: 8-22 mins $Therapeutic Exercise: 8-22 mins $Therapeutic Activity: 8-22 mins                     Abran Richard, PT Acute Rehab Services Pager [phone] [facility] [place] 12/22/2020, 3:04 PM

## 2020-12-22 NOTE — Discharge Instructions (Signed)

## 2020-12-22 NOTE — TOC Transition Note (Addendum)
Transition of Care [facility]) - CM/SW Discharge Note   Patient Details  Name: [name] MRN: [id number] Date of Birth: Feb 21, 1961  Transition of Care [facility] CM/SW Contact:  Dessa Phi, RN Phone Number: 12/22/2020, 10:08 AM   Clinical Narrative: d/c today home w/pre arranged HHPT through [facility] aware.Patient has rw already. [employer] [phone]-left vm for 3n1 to be delivered to rm prior d/c. No further CM needs.            Patient Goals and CMS Choice        Discharge Placement                       Discharge Plan and Services                                     Social Determinants of Health (SDOH) Interventions     Readmission Risk Interventions No flowsheet data found.

## 2020-12-22 NOTE — Progress Notes (Signed)
Subjective: 1 Day Post-Op Procedure(s) (LRB): RIGHT TOTAL KNEE ARTHROPLASTY (Right) Patient reports pain as moderate.  Significant nausea and a head ache.  Also some chest pain.  No SOB and vitals stable.  Labs stable also.  Objective: Vital signs in last 24 hours: Temp:  [97.6 F (36.4 C)-99.3 F (37.4 C)] 98.4 F (36.9 C) (05/21 0935) Pulse Rate:  [80-102] 82 (05/21 0935) Resp:  [16-20] 18 (05/21 0935) BP: (127-155)/(74-93) 153/85 (05/21 0935) SpO2:  [94 %-99 %] 97 % (05/21 0935)  Intake/Output from previous day: 05/20 0701 - 05/21 0700 In: 3228.8 [P.O.:960; I.V.:2168.8; IV Piggyback:100] Out: 3825 [Urine:3800; Blood:25] Intake/Output this shift: Total I/O In: 120 [P.O.:120] Out: 100 [Urine:100]  Recent Labs    12/22/20 0244  HGB 12.1   Recent Labs    12/22/20 0244  WBC 12.5*  RBC 4.08  HCT 37.7  PLT 365   Recent Labs    12/22/20 0244  NA 138  K 3.9  CL 106  CO2 25  BUN 15  CREATININE 0.69  GLUCOSE 151*  CALCIUM 9.0   No results for input(s): LABPT, INR in the last 72 hours.  Sensation intact distally Intact pulses distally Dorsiflexion/Plantar flexion intact Incision: dressing C/D/I No cellulitis present Compartment soft   Assessment/Plan: 1 Day Post-Op Procedure(s) (LRB): RIGHT TOTAL KNEE ARTHROPLASTY (Right) Up with therapy when tolerated. Will add some pain meds (Toradol and Tramadol) Will need to stay today for sure      Mcarthur Rossetti 12/22/2020, 10:36 AM

## 2020-12-22 NOTE — Progress Notes (Signed)
Physical Therapy Treatment Patient Details Name: [name] MRN: [id number] DOB: 02-Sep-1960 Today's Date: 12/22/2020    History of Present Illness Patient is 60 y.o. female s/p Rt TKA on 12/21/20 HTN, HLD, asthma, OA, GERD.    PT Comments    Pt with limited PT session due to c/o headache that worsens when upright (pt reports MD mentioned possible spinal headache), nausea, and a chest pulling sensation. MD is aware of headache and chest pain. VSS.  Only performed transfer to and from [name] and some supine exercises. Will f/u in afternoon as able.     Follow Up Recommendations  Follow surgeon's recommendation for DC plan and follow-up therapies     Equipment Recommendations  None recommended by PT    Recommendations for Other Services       Precautions / Restrictions Precautions Precautions: Fall Restrictions Weight Bearing Restrictions: No Other Position/Activity Restrictions: WBAT    Mobility  Bed Mobility Overal bed mobility: Needs Assistance Bed Mobility: Supine to Sit;Sit to Supine     Supine to sit: Min assist Sit to supine: Min assist   General bed mobility comments: Min A for R leg; use of rails and HOB elevated    Transfers Overall transfer level: Needs assistance Equipment used: Rolling walker (2 wheeled) Transfers: Sit to/from Stand Sit to Stand: Min assist;From elevated surface         General transfer comment: Cues for hand placement, min A for R LE management with return to sitting.  Ambulation/Gait Ambulation/Gait assistance: Min guard Gait Distance (Feet): 2 Feet (2'x2) Assistive device: Rolling walker (2 wheeled) Gait Pattern/deviations: Step-to pattern;Decreased stride length;Decreased weight shift to right Gait velocity: decreased   General Gait Details: Cues for RW; steps to [name] and back to bed only due to headache and chest pain   Stairs             Wheelchair Mobility    Modified Rankin (Stroke Patients Only)        Balance Overall balance assessment: Needs assistance Sitting-balance support: Feet supported Sitting balance-Leahy Scale: Good     Standing balance support: During functional activity;Bilateral upper extremity supported Standing balance-Leahy Scale: Poor Standing balance comment: requiring RW                            Cognition Arousal/Alertness: Awake/alert Behavior During Therapy: WFL for tasks assessed/performed Overall Cognitive Status: Within Functional Limits for tasks assessed                                        Exercises Total Joint Exercises Ankle Circles/Pumps: AROM;Both;10 reps;Supine Quad Sets: AROM;Both;5 reps;Supine    General Comments General comments (skin integrity, edema, etc.): In addition to knee pain, pt reports chest/back pulling sensation and a headache that is severe with standing and relieved when laying flat.  MD recently left and is aware.  Pt reports he has ordered change in pain meds but also mentioned spinal headache and blood patch (Did not note any drainage on back or bed during session).  K-pad ordered for chest/back pain. Pt also with nausea and received nausea meds prior. Limited therapy session due to pt's nausea, ?spinal headache, and chest pain.      Pertinent Vitals/Pain Pain Assessment: 0-10 Pain Score: 7  Pain Location: R knee - pain, L chest/back pulling sensation, headache -"splitting" when OOB eased  in supine Pain Intervention(s): Limited activity within patient's tolerance;Monitored during session;Repositioned;Ice applied;Relaxation;Premedicated before session    Home Living                      Prior Function            PT Goals (current goals can now be found in the care plan section) Acute Rehab PT Goals Patient Stated Goal: get back to hiking and walking her golden retriever PT Goal Formulation: With patient Time For Goal Achievement: 12/28/20 Potential to Achieve Goals:  Good Progress towards PT goals: Not progressing toward goals - comment (medical complications)    Frequency    7X/week      PT Plan Current plan remains appropriate    Co-evaluation              AM-PAC PT "6 Clicks" Mobility   Outcome Measure  Help needed turning from your back to your side while in a flat bed without using bedrails?: A Little Help needed moving from lying on your back to sitting on the side of a flat bed without using bedrails?: A Little Help needed moving to and from a bed to a chair (including a wheelchair)?: A Little Help needed standing up from a chair using your arms (e.g., wheelchair or bedside chair)?: A Little Help needed to walk in hospital room?: A Little Help needed climbing 3-5 steps with a railing? : A Lot 6 Click Score: 17    End of Session Equipment Utilized During Treatment: Gait belt;Right knee immobilizer Activity Tolerance: Treatment limited secondary to medical complications (Comment) (see general comments) Patient left: in bed;with call bell/phone within reach;with family/visitor present;with bed alarm set (bed flat) Nurse Communication: Mobility status PT Visit Diagnosis: Muscle weakness (generalized) (M62.81);Difficulty in walking, not elsewhere classified (R26.2);Pain Pain - Right/Left: Right Pain - part of body: Knee     Time: 1045-1105 PT Time Calculation (min) (ACUTE ONLY): 20 min  Charges:  $Therapeutic Activity: 8-22 mins                     Abran Richard, PT Acute Rehab Services Pager [phone] [facility] [phone]     Karlton Lemon 12/22/2020, 11:15 AM

## 2020-12-23 DIAGNOSIS — M1711 Unilateral primary osteoarthritis, right knee: Secondary | ICD-10-CM | POA: Diagnosis present

## 2020-12-23 DIAGNOSIS — R519 Headache, unspecified: Secondary | ICD-10-CM | POA: Diagnosis not present

## 2020-12-23 DIAGNOSIS — Z888 Allergy status to other drugs, medicaments and biological substances status: Secondary | ICD-10-CM | POA: Diagnosis not present

## 2020-12-23 DIAGNOSIS — E785 Hyperlipidemia, unspecified: Secondary | ICD-10-CM | POA: Diagnosis present

## 2020-12-23 DIAGNOSIS — K219 Gastro-esophageal reflux disease without esophagitis: Secondary | ICD-10-CM | POA: Diagnosis present

## 2020-12-23 DIAGNOSIS — Z85828 Personal history of other malignant neoplasm of skin: Secondary | ICD-10-CM | POA: Diagnosis not present

## 2020-12-23 DIAGNOSIS — Z96651 Presence of right artificial knee joint: Secondary | ICD-10-CM

## 2020-12-23 DIAGNOSIS — E039 Hypothyroidism, unspecified: Secondary | ICD-10-CM | POA: Diagnosis present

## 2020-12-23 DIAGNOSIS — Z881 Allergy status to other antibiotic agents status: Secondary | ICD-10-CM | POA: Diagnosis not present

## 2020-12-23 DIAGNOSIS — Z79899 Other long term (current) drug therapy: Secondary | ICD-10-CM | POA: Diagnosis not present

## 2020-12-23 DIAGNOSIS — Z91048 Other nonmedicinal substance allergy status: Secondary | ICD-10-CM | POA: Diagnosis not present

## 2020-12-23 DIAGNOSIS — Z882 Allergy status to sulfonamides status: Secondary | ICD-10-CM | POA: Diagnosis not present

## 2020-12-23 DIAGNOSIS — J45909 Unspecified asthma, uncomplicated: Secondary | ICD-10-CM | POA: Diagnosis present

## 2020-12-23 DIAGNOSIS — R110 Nausea: Secondary | ICD-10-CM | POA: Diagnosis not present

## 2020-12-23 NOTE — Progress Notes (Signed)
Patient ID: [name], female   DOB: 01-May-1961, 60 y.o.   MRN: [id number]   Subjective: 2 Days Post-Op Procedure(s) (LRB): RIGHT TOTAL KNEE ARTHROPLASTY (Right) Patient reports pain as moderate.  Severe HA when she gets up to go to bathroom. Associated nausea.    Objective: Vital signs in last 24 hours: Temp:  [98 F (36.7 C)-99.7 F (37.6 C)] 98 F (36.7 C) (05/22 0537) Pulse Rate:  [79-94] 79 (05/22 0537) Resp:  [16-20] 16 (05/22 0537) BP: (134-153)/(77-85) 134/77 (05/22 0537) SpO2:  [96 %-97 %] 96 % (05/22 0537)  Intake/Output from previous day: 05/21 0701 - 05/22 0700 In: 1223 [P.O.:1200; I.V.:23] Out: 1650 [Urine:1650] Intake/Output this shift: No intake/output data recorded.  Recent Labs    12/22/20 0244  HGB 12.1   Recent Labs    12/22/20 0244  WBC 12.5*  RBC 4.08  HCT 37.7  PLT 365   Recent Labs    12/22/20 0244  NA 138  K 3.9  CL 106  CO2 25  BUN 15  CREATININE 0.69  GLUCOSE 151*  CALCIUM 9.0   No results for input(s): LABPT, INR in the last 72 hours.  Neurologically intact No results found.  Assessment/Plan: 2 Days Post-Op Procedure(s) (LRB): RIGHT TOTAL KNEE ARTHROPLASTY (Right) Plan:  Would stay in bed today OK logroll. If still with HA tomorrow consider blood patch. Her HA is slightly better than yesterday and may resolve with one day flat.   Marybelle Killings 12/23/2020, 7:04 AM

## 2020-12-23 NOTE — Plan of Care (Signed)
  Problem: Coping: Goal: Level of anxiety will decrease Outcome: Progressing   Problem: Activity: Goal: Risk for activity intolerance will decrease Outcome: Progressing   Problem: Pain Managment: Goal: General experience of comfort will improve Outcome: Progressing   Problem: Safety: Goal: Ability to remain free from injury will improve Outcome: Progressing

## 2020-12-23 NOTE — Progress Notes (Signed)
Physical Therapy Treatment Patient Details Name: RUVI FULLENWIDER MRN: [id number] DOB: 03/31/1961 Today's Date: 12/23/2020    History of Present Illness Patient is 60 y.o. female s/p Rt TKA on 12/21/20 HTN, HLD, asthma, OA, GERD.    PT Comments    Pt again c/o of headache.  Pt assisted with LE exercises in supine.  Pt hopeful to be able to mobilize tomorrow.    Follow Up Recommendations  Follow surgeon's recommendation for DC plan and follow-up therapies     Equipment Recommendations  None recommended by PT    Recommendations for Other Services       Precautions / Restrictions Precautions Precautions: Fall;Knee Restrictions Other Position/Activity Restrictions: WBAT    Mobility  Bed Mobility                    Transfers                    Ambulation/Gait                 Stairs             Wheelchair Mobility    Modified Rankin (Stroke Patients Only)       Balance                                            Cognition Arousal/Alertness: Awake/alert Behavior During Therapy: WFL for tasks assessed/performed Overall Cognitive Status: Within Functional Limits for tasks assessed                                        Exercises Total Joint Exercises Ankle Circles/Pumps: AROM;Both;10 reps;Supine Quad Sets: AROM;Both;Supine;10 reps Short Arc QuadSinclair Ship;Right;10 reps;Supine Heel Slides: AAROM;Supine;Right;10 reps Hip ABduction/ADduction: AAROM;Right;10 reps;Supine Straight Leg Raises: AAROM;Right;10 reps;Supine Goniometric ROM: Rt knee AAROM 0-30* limited by pain    General Comments        Pertinent Vitals/Pain Pain Assessment: 0-10 Pain Score: 7  Pain Location: headache Pain Descriptors / Indicators: Headache Pain Intervention(s): Repositioned;Monitored during session    Home Living                      Prior Function            PT Goals (current goals can now be found in  the care plan section) Progress towards PT goals: Progressing toward goals    Frequency    7X/week      PT Plan Current plan remains appropriate    Co-evaluation              AM-PAC PT "6 Clicks" Mobility   Outcome Measure  Help needed turning from your back to your side while in a flat bed without using bedrails?: A Little Help needed moving from lying on your back to sitting on the side of a flat bed without using bedrails?: A Little Help needed moving to and from a bed to a chair (including a wheelchair)?: A Little Help needed standing up from a chair using your arms (e.g., wheelchair or bedside chair)?: A Little Help needed to walk in hospital room?: A Little Help needed climbing 3-5 steps with a railing? : A Lot 6 Click Score: 17    End of Session   Activity Tolerance:  Other (comment) (maintained bed rest, pt with tolerable knee pain with movement, mostly c/o headache) Patient left: in bed;with call bell/phone within reach;with bed alarm set   PT Visit Diagnosis: Muscle weakness (generalized) (M62.81);Difficulty in walking, not elsewhere classified (R26.2)     Time: 1583-0940 PT Time Calculation (min) (ACUTE ONLY): 14 min  Charges:  $Therapeutic Exercise: 8-22 mins                     Jannette Spanner PT, DPT Acute Rehabilitation Services Pager: [phone] Office: [phone]   Trena Platt 12/23/2020, 3:47 PM

## 2020-12-23 NOTE — Progress Notes (Signed)
Physical Therapy Treatment Patient Details Name: [name] MRN: [id number] DOB: Sep 13, 1960 Today's Date: 12/23/2020    History of Present Illness Patient is 60 y.o. female s/p Rt TKA on 12/21/20 HTN, HLD, asthma, OA, GERD.    PT Comments    Pt on bedrest today however okay for exercises in supine.  Pt assisted with exercises and requests another session this afternoon since she requires assist/use of gait belt to complete exercises.    Follow Up Recommendations  Follow surgeon's recommendation for DC plan and follow-up therapies     Equipment Recommendations  None recommended by PT    Recommendations for Other Services       Precautions / Restrictions Precautions Precautions: Fall;Knee Restrictions Other Position/Activity Restrictions: WBAT    Mobility  Bed Mobility                    Transfers                    Ambulation/Gait                 Stairs             Wheelchair Mobility    Modified Rankin (Stroke Patients Only)       Balance                                            Cognition Arousal/Alertness: Awake/alert Behavior During Therapy: WFL for tasks assessed/performed Overall Cognitive Status: Within Functional Limits for tasks assessed                                        Exercises Total Joint Exercises Ankle Circles/Pumps: AROM;Both;10 reps;Supine Quad Sets: AROM;Both;Supine;10 reps Short Arc QuadSinclair Ship;Right;10 reps;Supine Heel Slides: AAROM;Supine;Right;10 reps Hip ABduction/ADduction: AAROM;Right;10 reps;Supine Straight Leg Raises: AAROM;Right;10 reps;Supine Goniometric ROM: Rt knee AAROM 0-30* limited by pain    General Comments        Pertinent Vitals/Pain Pain Assessment: 0-10 Pain Score: 7  Pain Location: headache Pain Descriptors / Indicators: Headache Pain Intervention(s): Repositioned;Monitored during session    Home Living                       Prior Function            PT Goals (current goals can now be found in the care plan section) Progress towards PT goals: Progressing toward goals    Frequency    7X/week      PT Plan Current plan remains appropriate    Co-evaluation              AM-PAC PT "6 Clicks" Mobility   Outcome Measure  Help needed turning from your back to your side while in a flat bed without using bedrails?: A Little Help needed moving from lying on your back to sitting on the side of a flat bed without using bedrails?: A Little Help needed moving to and from a bed to a chair (including a wheelchair)?: A Little Help needed standing up from a chair using your arms (e.g., wheelchair or bedside chair)?: A Little Help needed to walk in hospital room?: A Little Help needed climbing 3-5 steps with a railing? : A Lot 6 Click Score: 17  End of Session   Activity Tolerance: Other (comment) (maintained bed rest, pt with tolerable knee pain with movement, mostly c/o headache) Patient left: in bed;with call bell/phone within reach;with bed alarm set   PT Visit Diagnosis: Muscle weakness (generalized) (M62.81);Difficulty in walking, not elsewhere classified (R26.2)     Time: 6160-7371 PT Time Calculation (min) (ACUTE ONLY): 21 min  Charges:  $Therapeutic Exercise: 8-22 mins                    Jannette Spanner PT, DPT Acute Rehabilitation Services Pager: [phone] Office: [phone]   York Ram E 12/23/2020, 3:45 PM

## 2020-12-24 ENCOUNTER — Encounter (HOSPITAL_COMMUNITY): Payer: Self-pay | Admitting: Orthopaedic Surgery

## 2020-12-24 MED ORDER — BUTALBITAL-APAP-CAFFEINE 50-325-40 MG PO TABS: 1.0000 | ORAL_TABLET | ORAL | Status: DC | PRN

## 2020-12-24 MED ORDER — LACTATED RINGERS IV SOLN: INTRAVENOUS | Status: AC

## 2020-12-24 NOTE — Progress Notes (Signed)
Patient ID: [name], female   DOB: [birth year], 60 y.o.   MRN: [id number] She is awake and alert this morning.  She stated basically on logroll yesterday due to persistent headache potentially related to her spinal anesthesia.  She denies a headache this morning.  She is still been lying flat.  I did elevate the head of the bed and sat her up and she was able to lift her head off the pillow without a headache.  We will need to see how she does this morning with attempting an upright position more and trying to increase her mobility.  If the headache persists, we will consult anesthesia for the possibility of a blood patch later today if needed.  We will need to likely stay here today and consider discharge tomorrow once the patient is feeling better.

## 2020-12-24 NOTE — Anesthesia Post-op Follow-up Note (Signed)
  Anesthesia Pain Follow-up Note  Patient: [name]  Day #: 3  Date of Follow-up: 12/24/2020 Time: 2:29 PM  Last Vitals:  Vitals:   12/24/20 0500 12/24/20 1332  BP: 140/76 135/82  Pulse:  75  Resp:  17  Temp: 36.8 C 37 C  SpO2: 95% 97%    Level of Consciousness: alert  Pain: mild   Side Effects:None   Plan: Anesthesia consulted at request of ortho team for possible PDPH and evaluation for blood patch s/p TKR 12/21/20. Patient reporting temporal focus with photophobia andrelief in supine position. Pain medications seem to improve her symptoms. Record reviewed, 24g Pencan needle atraumatic single attempt with +CSF. Site examined and WNL. Given patient's overall improvement at this time, recommend conservative measurements at this time including IVF and Fiorcet PRN for symptomatic control vs blood patch. Discussed with primary team, thank you.   [place]

## 2020-12-24 NOTE — Addendum Note (Signed)
Addendum  created 12/24/20 1710 by Sharice Harriss, March Rummage, DO   Order list changed, Pharmacy for encounter modified

## 2020-12-24 NOTE — Progress Notes (Signed)
Physical Therapy Treatment Patient Details Name: [name] MRN: [id number] DOB: Oct 22, 1960 Today's Date: 12/24/2020    History of Present Illness Patient is 60 y.o. female s/p Rt TKA on 12/21/20 HTN, HLD, asthma, OA, GERD.    PT Comments    POD # 3 pm session Headache 90% gone.  Still light sensitive.  Assisted OOB to bathroom, amb in hallway, practice stairs Then returned to room to perform some TE's following HEP handout.  Instructed on proper tech, freq as well as use of ICE.   Pt plans to D/C to home tomorrow.   Follow Up Recommendations  Follow surgeon's recommendation for DC plan and follow-up therapies     Equipment Recommendations  None recommended by PT    Recommendations for Other Services       Precautions / Restrictions Precautions Precautions: Fall;Knee Precaution Comments: instructed no pillow under knee Restrictions Weight Bearing Restrictions: No Other Position/Activity Restrictions: WBAT    Mobility  Bed Mobility Overal bed mobility: Needs Assistance Bed Mobility: Supine to Sit;Sit to Supine     Supine to sit: Supervision Sit to supine: Supervision   General bed mobility comments: pt self able to transition in/out bed by "hooking" her opposite foot    Transfers Overall transfer level: Needs assistance Equipment used: Rolling walker (2 wheeled) Transfers: Sit to/from [facility] Sit to Stand: Supervision Stand pivot transfers: Supervision       General transfer comment: with increased time pt able to self transfer  Ambulation/Gait Ambulation/Gait assistance: Supervision Gait Distance (Feet): 75 Feet Assistive device: Rolling walker (2 wheeled) Gait Pattern/deviations: Step-to pattern;Decreased stride length;Decreased weight shift to right Gait velocity: decreased   General Gait Details: pt amb in hallway 75 feet wearing dark sunglasses due to light sensitivity but no true headache.  Much improved.   Stairs Stairs:  Yes Stairs assistance: Supervision;Min guard Stair Management: One rail Right;Step to pattern;Forwards Number of Stairs: 2 General stair comments: pt practiced stairs twice using one R rail and 2 hands with 50% VC's on proper tech.   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"      Exercises   Total Knee Replacement TE's following HEP handout 10 reps B LE ankle pumps 05 reps heel slides  05 reps SLR's 05 reps ABD Educated on use of gait belt to assist with TE's Followed by ICE    General Comments        Pertinent Vitals/Pain Pain Assessment: 0-10 Pain Score: 5  Pain Location: 5/10 with knee flex, HA improved approx 90% Pain Descriptors / Indicators: Tightness;Grimacing;Discomfort Pain Intervention(s): Monitored during session;Premedicated before session;Patient requesting pain meds-RN notified;Repositioned    Home Living                      Prior Function  PT Goals (current goals can now be found in the care plan section)      Frequency    7X/week      PT Plan Current plan remains appropriate    Co-evaluation              AM-PAC PT "6 Clicks" Mobility   Outcome Measure  Help needed turning from your back to your side while in a flat bed without using bedrails?: A Little Help needed moving from lying on your back to sitting on the side of a flat bed without using bedrails?: A Little Help needed moving to and from a bed to a chair (including a wheelchair)?: A Little Help needed standing up from a chair using your arms (e.g., wheelchair or bedside  chair)?: A Little Help needed to walk in hospital room?: A Little Help needed climbing 3-5 steps with a railing? : A Little 6 Click Score: 18    End of Session Equipment Utilized During Treatment: Gait belt Activity Tolerance: Patient tolerated treatment well Patient left: in bed;with call bell/phone within reach;with bed alarm set Nurse Communication: Mobility status PT Visit Diagnosis: Muscle weakness (generalized) (M62.81);Difficulty in walking, not elsewhere classified (R26.2) Pain - Right/Left: Right Pain - part of body: Knee     Time: 8242-3536 PT Time Calculation (min) (ACUTE ONLY): 40 min  Charges:  $Gait Training: 8-22 mins $Therapeutic Exercise: 8-22 mins $Therapeutic Activity: 8-22 mins                     Rica Koyanagi  PTA Acute  Rehabilitation Services Pager      [phone] Office      [phone]

## 2020-12-24 NOTE — Progress Notes (Signed)
Physical Therapy Treatment Patient Details Name: [name] MRN: [id number] DOB: June 07, 1961 Today's Date: 12/24/2020    History of Present Illness Patient is 60 y.o. female s/p Rt TKA on 12/21/20 HTN, HLD, asthma, OA, GERD.    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   Assisted OOB to amb.  General bed mobility comments: pt self able to transition in/out bed by "hooking" her opposite foot.  General transfer comment: with increased time pt able to self transfer.  General Gait Details: remained in room with lights off pt was able to take several "laps" around with room esp 35 feet timed 8 min upright which also included static standing at sink to brush teeth and hair.  HA "gone" but still feels a little "light" headed with activity which subsides with rest.  Then  performed some TE's following HEP handout.  Instructed on proper tech, freq as well as use of ICE.  Knee flex is limited to only approx 40 degrees due to tightness/pain.      Follow Up Recommendations  Follow surgeon's recommendation for DC plan and follow-up therapies     Equipment Recommendations  None recommended by PT    Recommendations for Other Services       Precautions / Restrictions Precautions Precautions: Fall;Knee Restrictions Weight Bearing Restrictions: No    Mobility  Bed Mobility Overal bed mobility: Needs Assistance       Supine to sit: Supervision Sit to supine: Supervision   General bed mobility comments: pt self able to transition in/out bed by "hooking" her opposite foot    Transfers Overall transfer level: Needs assistance Equipment used: Rolling walker (2 wheeled) Transfers: Sit to/from [facility] Sit to Stand: Supervision Stand pivot transfers: Supervision       General transfer comment: with increased time pt  able to self transfer  Ambulation/Gait Ambulation/Gait assistance: Supervision Gait Distance (Feet): 35 Feet Assistive device: Rolling walker (2 wheeled) Gait Pattern/deviations: Step-to pattern;Decreased stride length;Decreased weight shift to right Gait velocity: decreased   General Gait Details: remained in room with lights off pt was able to take several "laps" around with room esp 35 feet timed 8 min upright which also included static standing at sink to brush teeth and hair.  HA "gone" but still feels a little "light" with activity.   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"      Exercises      General Comments        Pertinent Vitals/Pain Pain Assessment: 0-10 Pain Score: 6  Pain  Location: with knee flex, HA improved approx 85% Pain Descriptors / Indicators: Tightness Pain Intervention(s): Monitored during session;Premedicated before session;Repositioned;Ice applied    Home Living                      Prior Function            PT Goals (current goals can now be found in the care plan section) Progress towards PT goals: Progressing toward goals    Frequency    7X/week      PT Plan Current plan remains appropriate    Co-evaluation              AM-PAC PT "6 Clicks" Mobility   Outcome Measure  Help needed turning from your back to your side while in a flat bed without using bedrails?: A Little Help needed moving from lying on your back to sitting on the side of a flat bed without using bedrails?: A Little Help  needed moving to and from a bed to a chair (including a wheelchair)?: A Little Help needed standing up from a chair using your arms (e.g., wheelchair or bedside chair)?: A Little Help needed to walk in hospital room?: A Little Help needed climbing 3-5 steps with a railing? : A Little 6 Click Score: 18    End of Session Equipment Utilized During Treatment: Gait belt Activity Tolerance: Other (comment) (slow improvement) Patient left: in bed;with call bell/phone within reach;with bed alarm set Nurse Communication: Mobility status PT Visit Diagnosis: Muscle weakness (generalized) (M62.81);Difficulty in walking, not elsewhere classified (R26.2) Pain - Right/Left: Right Pain - part of body: Knee     Time: 1005-1045 PT Time Calculation (min) (ACUTE ONLY): 40 min  Charges:  $Gait Training: 8-22 mins $Therapeutic Exercise: 8-22 mins $Therapeutic Activity: 8-22 mins                     Rica Koyanagi  PTA Acute  Rehabilitation Services Pager      [phone] Office      [phone]

## 2020-12-24 NOTE — Progress Notes (Signed)
Patient ID: [name], female   DOB: [birth year], 60 y.o.   MRN: [id number]  Patient with improved headache symptoms but still aura that headache may return . Also with phonophobia and photophobia. Spoke with Anesthesiologist about possible spinal headache . Anesthesia to see patient later today.

## 2020-12-24 NOTE — Addendum Note (Signed)
Addendum  created 12/24/20 1440 by Darral Dash, DO   Clinical Note Signed

## 2020-12-25 MED ORDER — ASPIRIN 81 MG PO CHEW: 81.0000 mg | CHEWABLE_TABLET | Freq: Two times a day (BID) | ORAL | 0 refills | Status: DC

## 2020-12-25 MED ORDER — METHOCARBAMOL 500 MG PO TABS: 500.0000 mg | ORAL_TABLET | Freq: Four times a day (QID) | ORAL | 1 refills | Status: DC | PRN

## 2020-12-25 MED ORDER — OXYCODONE HCL 5 MG PO TABS: 5.0000 mg | ORAL_TABLET | ORAL | 0 refills | Status: DC | PRN

## 2020-12-25 MED ORDER — BUTALBITAL-APAP-CAFFEINE 50-325-40 MG PO TABS: 1.0000 | ORAL_TABLET | ORAL | 0 refills | Status: DC | PRN

## 2020-12-25 MED ORDER — BISACODYL 10 MG RE SUPP
10.0000 mg | Freq: Once | RECTAL | Status: AC
  Administered 2020-12-25: 10 mg via RECTAL
  Filled 2020-12-25: qty 1

## 2020-12-25 NOTE — Progress Notes (Signed)
Physical Therapy Treatment Patient Details Name: [name] MRN: [id number] DOB: Feb 26, 1961 Today's Date: 12/25/2020    History of Present Illness Patient is 60 y.o. female s/p Rt TKA on 12/21/20 HTN, HLD, asthma, OA, GERD.    PT Comments    POD # 4 HA resolved.  Still feels a little "woozy" with exertion.  Pt instructed by MD to go home and "take it easy". Assisted with amb to bathroom, then in hallway and practiced stairs.  Returned to room and did a few TE's as pt started to feel "tired".  Returned to bed to allow her to rest before she D/C to home.   Follow Up Recommendations  Follow surgeon's recommendation for DC plan and follow-up therapies     Equipment Recommendations  None recommended by PT    Recommendations for Other Services       Precautions / Restrictions Precautions Precautions: Fall;Knee Precaution Comments: instructed no pillow under knee Restrictions Weight Bearing Restrictions: No Other Position/Activity Restrictions: WBAT    Mobility  Bed Mobility Overal bed mobility: Needs Assistance Bed Mobility: Supine to Sit;Sit to Supine     Supine to sit: Supervision Sit to supine: Supervision   General bed mobility comments: pt self able to transition in/out bed by "hooking" her opposite foot    Transfers Overall transfer level: Needs assistance Equipment used: Rolling walker (2 wheeled) Transfers: Sit to/from [employer] Transfers   Stand pivot transfers: Supervision       General transfer comment: with increased time pt able to self transfer.  Also toilet transfer.  Ambulation/Gait Ambulation/Gait assistance: Supervision Gait Distance (Feet): 35 Feet Assistive device: Rolling walker (2 wheeled) Gait Pattern/deviations: Step-to pattern;Decreased stride length;Decreased weight shift to right Gait velocity: decreased   General Gait Details: decreased amb distance due to extended time in bathroom.  Tolerated a functional  distance.   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"      Exercises      General Comments        Pertinent Vitals/Pain Pain Assessment: 0-10 Pain Score: 6  Pain Location: R knee Pain Descriptors / Indicators: Tightness;Grimacing;Discomfort    Home Living                      Prior Function            PT Goals (current goals can now be found in the care plan section) Progress towards PT goals: Progressing toward goals    Frequency    7X/week      PT Plan Current plan remains appropriate    Co-evaluation  AM-PAC PT "6 Clicks" Mobility   Outcome Measure  Help needed turning from your back to your side while in a flat bed without using bedrails?: A Little Help needed moving from lying on your back to sitting on the side of a flat bed without using bedrails?: A Little Help needed moving to and from a bed to a chair (including a wheelchair)?: A Little Help needed standing up from a chair using your arms (e.g., wheelchair or bedside chair)?: A Little Help needed to walk in hospital room?: A Little Help needed climbing 3-5 steps with a railing? : A Little 6 Click Score: 18    End of Session Equipment Utilized During Treatment: Gait belt Activity Tolerance: Patient tolerated treatment well Patient left: in bed;with call bell/phone  within reach;with bed alarm set Nurse Communication: Mobility status PT Visit Diagnosis: Muscle weakness (generalized) (M62.81);Difficulty in walking, not elsewhere classified (R26.2) Pain - Right/Left: Right Pain - part of body: Knee     Time: 0940-1020 PT Time Calculation (min) (ACUTE ONLY): 40 min  Charges:  $Gait Training: 8-22 mins $Therapeutic Exercise: 8-22 mins $Therapeutic Activity: 8-22 mins                     Rica Koyanagi  PTA Acute  Rehabilitation Services Pager      [phone] Office      [phone]

## 2020-12-25 NOTE — Discharge Summary (Signed)
Patient ID: [name] MRN: [id number] DOB/AGE: [birth year] 61 y.o.  Admit date: 12/21/2020 Discharge date: 12/25/2020  Admission Diagnoses:  Principal Problem:   Unilateral primary osteoarthritis, right knee Active Problems:   Status post total hip replacement, right   Status post right knee replacement   Discharge Diagnoses:  Same  Past Medical History:  Diagnosis Date  . Arthritis   . Asthma   . Endometriosis   . GERD (gastroesophageal reflux disease)   . History of kidney stones   . History of migraine headaches   . Hyperlipidemia   . Hypothyroidism   . Insomnia   . Pneumonia   . PONV (postoperative nausea and vomiting)   . Skin cancer    Face    Surgeries: Procedure(s): RIGHT TOTAL KNEE ARTHROPLASTY on 12/21/2020   Consultants:   Discharged Condition: Improved  Hospital Course: [name] is an 60 y.o. female who was admitted 12/21/2020 for operative treatment ofUnilateral primary osteoarthritis, right knee. Patient has severe unremitting pain that affects sleep, daily activities, and work/hobbies. After pre-op clearance the patient was taken to the operating room on 12/21/2020 and underwent  Procedure(s): RIGHT TOTAL KNEE ARTHROPLASTY.    Patient was given perioperative antibiotics:  Anti-infectives (From admission, onward)   Start     Dose/Rate Route Frequency Ordered Stop   12/21/20 1930  ceFAZolin (ANCEF) IVPB 1 g/50 mL premix  Status:  Discontinued        1 g 100 mL/hr over 30 Minutes Intravenous  Once 12/21/20 1127 12/21/20 1321   12/21/20 1930  ceFAZolin (ANCEF) IVPB 1 g/50 mL premix  Status:  Discontinued        1 g 100 mL/hr over 30 Minutes Intravenous  Once 12/21/20 1321 12/21/20 1332   12/21/20 1930  ceFAZolin (ANCEF) 1 g in sodium chloride 0.9 % 100 mL IVPB        1 g 200 mL/hr over 30 Minutes Intravenous  Once 12/21/20 1332 12/21/20 2049   12/21/20 1330  ceFAZolin (ANCEF) IVPB 1 g/50 mL premix  Status:  Discontinued        1 g 100 mL/hr over  30 Minutes Intravenous Every 6 hours 12/21/20 1051 12/21/20 1127   12/21/20 1330  ceFAZolin (ANCEF) 1 g in sodium chloride 0.9 % 100 mL IVPB        1 g 200 mL/hr over 30 Minutes Intravenous  Once 12/21/20 1125 12/21/20 1448   12/21/20 0600  ceFAZolin (ANCEF) IVPB 2g/100 mL premix        2 g 200 mL/hr over 30 Minutes Intravenous On call to O.R. 12/21/20 0518 12/21/20 1448       Patient was given sequential compression devices, early ambulation, and chemoprophylaxis to prevent DVT.  Patient benefited maximally from hospital stay.  She did stay a few extra days due to a spinal headache from her spinal anesthesia.  This seemed to improve with hydration and keeping her supine for few days. Recent vital signs:  Patient Vitals for the past 24 hrs:  BP Temp Temp src Pulse Resp SpO2  12/25/20 0538 138/80 98.7 F (37.1 C) Oral 82 16 95 %  12/24/20 2203 (!) 145/78 99.6 F (37.6 C) Oral 87 17 98 %  12/24/20 1332 135/82 98.6 F (37 C) -- 75 17 97 %     Recent laboratory studies: No results for input(s): WBC, HGB, HCT, PLT, NA, K, CL, CO2, BUN, CREATININE, GLUCOSE, INR, CALCIUM in the last 72 hours.  Invalid input(s): PT, 2  Discharge Medications:   Allergies as of 12/25/2020      Reactions   Nickel Dermatitis, Itching, Rash, Swelling   Nitrofurantoin Shortness Of Breath   Erythromycin Nausea And Vomiting, Other (See Comments)   Sulfa Antibiotics Palpitations, Rash   Patient mentions redness of skin also.      Medication List    TAKE these medications   albuterol 108 (90 Base) MCG/ACT inhaler Commonly known as: VENTOLIN HFA Inhale 2 puffs into the lungs every 6 (six) hours as needed for wheezing or shortness of breath.   Artificial Tears 0.1-0.3 % Soln Generic drug: Dextran 70-Hypromellose Place 1 drop into both eyes daily as needed (dry eyes).   aspirin 81 MG chewable tablet Chew 1 tablet (81 mg total) by mouth 2 (two) times daily.   b complex vitamins capsule Take 1 capsule  by mouth daily.   butalbital-acetaminophen-caffeine 50-325-40 MG tablet Commonly known as: FIORICET Take 1 tablet by mouth every 4 (four) hours as needed for headache (PDPH).   cholecalciferol 25 MCG (1000 UNIT) tablet Commonly known as: VITAMIN D3 Take 1,000 Units by mouth daily.   fenofibrate 160 MG tablet Take 160 mg by mouth every evening.   Fish Oil 1000 MG Caps Take 1,000 mg by mouth daily.   ibuprofen 200 MG tablet Commonly known as: ADVIL Take 400-600 mg by mouth every 8 (eight) hours as needed for moderate pain.   methocarbamol 500 MG tablet Commonly known as: ROBAXIN Take 1 tablet (500 mg total) by mouth every 6 (six) hours as needed for muscle spasms.   montelukast 10 MG tablet Commonly known as: SINGULAIR Take 10 mg by mouth at bedtime.   oxyCODONE 5 MG immediate release tablet Commonly known as: Roxicodone Take 1 tablet (5 mg total) by mouth every 4 (four) hours as needed for severe pain.   pantoprazole 20 MG tablet Commonly known as: PROTONIX Take 20 mg by mouth every morning.   sodium chloride 0.65 % Soln nasal spray Commonly known as: OCEAN Place 1 spray into both nostrils as needed for congestion.            Durable Medical Equipment  (From admission, onward)         Start     Ordered   12/21/20 1052  DME 3 n 1  Once        12/21/20 1051   12/21/20 1052  DME Walker rolling  Once       Question Answer Comment  Walker: With 5 Inch Wheels   Patient needs a walker to treat with the following condition Status post total right knee replacement      12/21/20 1051          Diagnostic Studies: DG Knee Right Port  Result Date: 12/21/2020 CLINICAL DATA:  Postop knee replacement EXAM: PORTABLE RIGHT KNEE - 1-2 VIEW COMPARISON:  None. FINDINGS: Changes of right knee replacement. Soft tissue and joint space gas noted. No hardware bony complicating feature. IMPRESSION: Right knee replacement.  No visible complicating feature. Electronically Signed    By: Rolm Baptise M.D.   On: 12/21/2020 10:10    Disposition: Discharge disposition: 01-Home or Self Care          Follow-up Information    Mcarthur Rossetti, MD Follow up in 2 week(s).   Specialty: Orthopedic Surgery Contact information: [address] [place] [address] [phone]                Signed: Mcarthur Rossetti 12/25/2020,  7:37 AM

## 2020-12-25 NOTE — Progress Notes (Signed)
Patient ID: [name], female   DOB: 08-16-1960, 60 y.o.   MRN: [id number] The patient seems to be doing better today overall after anesthesia saw her and recommended some hydration and Fioricet.  She would like to try to get out more today and potentially be discharged this afternoon if she continues to make good progress.  I have encouraged her to also hydrate at home and to take it easy.  I will clear him for discharge today for this afternoon and can hold on that if nursing seems it is appropriate to keep her 1 more day.

## 2020-12-26 DIAGNOSIS — E039 Hypothyroidism, unspecified: Secondary | ICD-10-CM | POA: Diagnosis not present

## 2020-12-26 DIAGNOSIS — Z471 Aftercare following joint replacement surgery: Secondary | ICD-10-CM | POA: Diagnosis not present

## 2020-12-26 DIAGNOSIS — N809 Endometriosis, unspecified: Secondary | ICD-10-CM | POA: Diagnosis not present

## 2020-12-26 DIAGNOSIS — K219 Gastro-esophageal reflux disease without esophagitis: Secondary | ICD-10-CM | POA: Diagnosis not present

## 2020-12-26 DIAGNOSIS — G4700 Insomnia, unspecified: Secondary | ICD-10-CM | POA: Diagnosis not present

## 2020-12-26 DIAGNOSIS — J45909 Unspecified asthma, uncomplicated: Secondary | ICD-10-CM | POA: Diagnosis not present

## 2020-12-26 DIAGNOSIS — E785 Hyperlipidemia, unspecified: Secondary | ICD-10-CM | POA: Diagnosis not present

## 2020-12-26 DIAGNOSIS — G43909 Migraine, unspecified, not intractable, without status migrainosus: Secondary | ICD-10-CM | POA: Diagnosis not present

## 2020-12-26 DIAGNOSIS — Z85828 Personal history of other malignant neoplasm of skin: Secondary | ICD-10-CM | POA: Diagnosis not present

## 2020-12-26 DIAGNOSIS — Z87442 Personal history of urinary calculi: Secondary | ICD-10-CM | POA: Diagnosis not present

## 2020-12-26 DIAGNOSIS — Z96651 Presence of right artificial knee joint: Secondary | ICD-10-CM | POA: Diagnosis not present

## 2020-12-28 ENCOUNTER — Telehealth: Payer: Self-pay

## 2020-12-28 DIAGNOSIS — J45909 Unspecified asthma, uncomplicated: Secondary | ICD-10-CM | POA: Diagnosis not present

## 2020-12-28 DIAGNOSIS — Z85828 Personal history of other malignant neoplasm of skin: Secondary | ICD-10-CM | POA: Diagnosis not present

## 2020-12-28 DIAGNOSIS — E039 Hypothyroidism, unspecified: Secondary | ICD-10-CM | POA: Diagnosis not present

## 2020-12-28 DIAGNOSIS — G4700 Insomnia, unspecified: Secondary | ICD-10-CM | POA: Diagnosis not present

## 2020-12-28 DIAGNOSIS — Z96651 Presence of right artificial knee joint: Secondary | ICD-10-CM | POA: Diagnosis not present

## 2020-12-28 DIAGNOSIS — Z471 Aftercare following joint replacement surgery: Secondary | ICD-10-CM | POA: Diagnosis not present

## 2020-12-28 DIAGNOSIS — K219 Gastro-esophageal reflux disease without esophagitis: Secondary | ICD-10-CM | POA: Diagnosis not present

## 2020-12-28 DIAGNOSIS — G43909 Migraine, unspecified, not intractable, without status migrainosus: Secondary | ICD-10-CM | POA: Diagnosis not present

## 2020-12-28 DIAGNOSIS — E785 Hyperlipidemia, unspecified: Secondary | ICD-10-CM | POA: Diagnosis not present

## 2020-12-28 DIAGNOSIS — Z87442 Personal history of urinary calculi: Secondary | ICD-10-CM | POA: Diagnosis not present

## 2020-12-28 DIAGNOSIS — N809 Endometriosis, unspecified: Secondary | ICD-10-CM | POA: Diagnosis not present

## 2020-12-28 NOTE — Telephone Encounter (Signed)
Yes.  Tell her this can be normal.  It can be related to pain medications as well as being in a supine position.  She should try some Benadryl and even some hydrocortisone cream around that area of the rash.

## 2020-12-28 NOTE — Telephone Encounter (Signed)
Please advise. Normal?

## 2020-12-28 NOTE — Telephone Encounter (Signed)
Patient called she stated she has a rash on her back and it is red and itchy she is requesting a call back:[phone]

## 2020-12-28 NOTE — Telephone Encounter (Signed)
Called and advised. Pt stated understanding  °

## 2020-12-29 DIAGNOSIS — Z471 Aftercare following joint replacement surgery: Secondary | ICD-10-CM | POA: Diagnosis not present

## 2020-12-29 DIAGNOSIS — Z96651 Presence of right artificial knee joint: Secondary | ICD-10-CM | POA: Diagnosis not present

## 2020-12-29 DIAGNOSIS — N809 Endometriosis, unspecified: Secondary | ICD-10-CM | POA: Diagnosis not present

## 2020-12-29 DIAGNOSIS — E785 Hyperlipidemia, unspecified: Secondary | ICD-10-CM | POA: Diagnosis not present

## 2020-12-29 DIAGNOSIS — G4700 Insomnia, unspecified: Secondary | ICD-10-CM | POA: Diagnosis not present

## 2020-12-29 DIAGNOSIS — J45909 Unspecified asthma, uncomplicated: Secondary | ICD-10-CM | POA: Diagnosis not present

## 2020-12-29 DIAGNOSIS — E039 Hypothyroidism, unspecified: Secondary | ICD-10-CM | POA: Diagnosis not present

## 2020-12-29 DIAGNOSIS — Z85828 Personal history of other malignant neoplasm of skin: Secondary | ICD-10-CM | POA: Diagnosis not present

## 2020-12-29 DIAGNOSIS — Z87442 Personal history of urinary calculi: Secondary | ICD-10-CM | POA: Diagnosis not present

## 2020-12-29 DIAGNOSIS — G43909 Migraine, unspecified, not intractable, without status migrainosus: Secondary | ICD-10-CM | POA: Diagnosis not present

## 2020-12-29 DIAGNOSIS — K219 Gastro-esophageal reflux disease without esophagitis: Secondary | ICD-10-CM | POA: Diagnosis not present

## 2020-12-31 DIAGNOSIS — Z87442 Personal history of urinary calculi: Secondary | ICD-10-CM | POA: Diagnosis not present

## 2020-12-31 DIAGNOSIS — E039 Hypothyroidism, unspecified: Secondary | ICD-10-CM | POA: Diagnosis not present

## 2020-12-31 DIAGNOSIS — G4700 Insomnia, unspecified: Secondary | ICD-10-CM | POA: Diagnosis not present

## 2020-12-31 DIAGNOSIS — Z96651 Presence of right artificial knee joint: Secondary | ICD-10-CM | POA: Diagnosis not present

## 2020-12-31 DIAGNOSIS — N809 Endometriosis, unspecified: Secondary | ICD-10-CM | POA: Diagnosis not present

## 2020-12-31 DIAGNOSIS — J45909 Unspecified asthma, uncomplicated: Secondary | ICD-10-CM | POA: Diagnosis not present

## 2020-12-31 DIAGNOSIS — G43909 Migraine, unspecified, not intractable, without status migrainosus: Secondary | ICD-10-CM | POA: Diagnosis not present

## 2020-12-31 DIAGNOSIS — K219 Gastro-esophageal reflux disease without esophagitis: Secondary | ICD-10-CM | POA: Diagnosis not present

## 2020-12-31 DIAGNOSIS — Z471 Aftercare following joint replacement surgery: Secondary | ICD-10-CM | POA: Diagnosis not present

## 2020-12-31 DIAGNOSIS — E785 Hyperlipidemia, unspecified: Secondary | ICD-10-CM | POA: Diagnosis not present

## 2020-12-31 DIAGNOSIS — Z85828 Personal history of other malignant neoplasm of skin: Secondary | ICD-10-CM | POA: Diagnosis not present

## 2021-01-01 ENCOUNTER — Telehealth: Payer: Self-pay

## 2021-01-01 MED ORDER — OXYCODONE HCL 5 MG PO TABS: 5.0000 mg | ORAL_TABLET | ORAL | 0 refills | Status: DC | PRN

## 2021-01-01 NOTE — Telephone Encounter (Signed)
Please advise 

## 2021-01-01 NOTE — Telephone Encounter (Signed)
Pt called and would like a refill on her oxycodone.

## 2021-01-03 ENCOUNTER — Encounter: Payer: Self-pay | Admitting: Orthopaedic Surgery

## 2021-01-03 ENCOUNTER — Ambulatory Visit (INDEPENDENT_AMBULATORY_CARE_PROVIDER_SITE_OTHER): Payer: BC Managed Care – PPO | Admitting: Orthopaedic Surgery

## 2021-01-03 DIAGNOSIS — Z96651 Presence of right artificial knee joint: Secondary | ICD-10-CM | POA: Diagnosis not present

## 2021-01-03 DIAGNOSIS — G4700 Insomnia, unspecified: Secondary | ICD-10-CM | POA: Diagnosis not present

## 2021-01-03 DIAGNOSIS — J45909 Unspecified asthma, uncomplicated: Secondary | ICD-10-CM | POA: Diagnosis not present

## 2021-01-03 DIAGNOSIS — Z87442 Personal history of urinary calculi: Secondary | ICD-10-CM | POA: Diagnosis not present

## 2021-01-03 DIAGNOSIS — Z85828 Personal history of other malignant neoplasm of skin: Secondary | ICD-10-CM | POA: Diagnosis not present

## 2021-01-03 DIAGNOSIS — K219 Gastro-esophageal reflux disease without esophagitis: Secondary | ICD-10-CM | POA: Diagnosis not present

## 2021-01-03 DIAGNOSIS — E785 Hyperlipidemia, unspecified: Secondary | ICD-10-CM | POA: Diagnosis not present

## 2021-01-03 DIAGNOSIS — E039 Hypothyroidism, unspecified: Secondary | ICD-10-CM | POA: Diagnosis not present

## 2021-01-03 DIAGNOSIS — Z471 Aftercare following joint replacement surgery: Secondary | ICD-10-CM | POA: Diagnosis not present

## 2021-01-03 DIAGNOSIS — G43909 Migraine, unspecified, not intractable, without status migrainosus: Secondary | ICD-10-CM | POA: Diagnosis not present

## 2021-01-03 DIAGNOSIS — N809 Endometriosis, unspecified: Secondary | ICD-10-CM | POA: Diagnosis not present

## 2021-01-03 NOTE — Progress Notes (Signed)
Patient comes in today 2 weeks tomorrow status post a right total knee arthroplasty.  She did have a setback after surgery in terms of a persistent headache but did not end up needing any type of blood patch.  That is finally gotten better.  Her notes from therapy said that she is only been able to flex to about 70 degrees.  She denies any calf pain.  She is doing well otherwise.  She is ambling with a walker.  Her husband is with her today.  She is interested in outpatient physical therapy at [facility].  Examination of her right knee shows a calf that is soft.  I did remove her staples in place Steri-Strips.  There is no evidence of infection.  There is moderate swelling to be expected.  We will continue her muscle aches and pain medicine.  She will go down to an aspirin once a day for a week and then can stop her aspirin.  I gave her prescription for outpatient physical therapy.  All questions and concerns were answered addressed.  We will see her back in 4 weeks for repeat clinical exam.

## 2021-01-05 DIAGNOSIS — E785 Hyperlipidemia, unspecified: Secondary | ICD-10-CM | POA: Diagnosis not present

## 2021-01-05 DIAGNOSIS — G4700 Insomnia, unspecified: Secondary | ICD-10-CM | POA: Diagnosis not present

## 2021-01-05 DIAGNOSIS — K219 Gastro-esophageal reflux disease without esophagitis: Secondary | ICD-10-CM | POA: Diagnosis not present

## 2021-01-05 DIAGNOSIS — Z85828 Personal history of other malignant neoplasm of skin: Secondary | ICD-10-CM | POA: Diagnosis not present

## 2021-01-05 DIAGNOSIS — N809 Endometriosis, unspecified: Secondary | ICD-10-CM | POA: Diagnosis not present

## 2021-01-05 DIAGNOSIS — Z96651 Presence of right artificial knee joint: Secondary | ICD-10-CM | POA: Diagnosis not present

## 2021-01-05 DIAGNOSIS — Z471 Aftercare following joint replacement surgery: Secondary | ICD-10-CM | POA: Diagnosis not present

## 2021-01-05 DIAGNOSIS — Z87442 Personal history of urinary calculi: Secondary | ICD-10-CM | POA: Diagnosis not present

## 2021-01-05 DIAGNOSIS — J45909 Unspecified asthma, uncomplicated: Secondary | ICD-10-CM | POA: Diagnosis not present

## 2021-01-05 DIAGNOSIS — G43909 Migraine, unspecified, not intractable, without status migrainosus: Secondary | ICD-10-CM | POA: Diagnosis not present

## 2021-01-05 DIAGNOSIS — E039 Hypothyroidism, unspecified: Secondary | ICD-10-CM | POA: Diagnosis not present

## 2021-01-08 ENCOUNTER — Telehealth: Payer: Self-pay

## 2021-01-08 ENCOUNTER — Other Ambulatory Visit: Payer: Self-pay | Admitting: Orthopaedic Surgery

## 2021-01-08 ENCOUNTER — Telehealth: Payer: Self-pay | Admitting: Orthopaedic Surgery

## 2021-01-08 DIAGNOSIS — R262 Difficulty in walking, not elsewhere classified: Secondary | ICD-10-CM | POA: Diagnosis not present

## 2021-01-08 DIAGNOSIS — Z96651 Presence of right artificial knee joint: Secondary | ICD-10-CM | POA: Diagnosis not present

## 2021-01-08 DIAGNOSIS — M25661 Stiffness of right knee, not elsewhere classified: Secondary | ICD-10-CM | POA: Diagnosis not present

## 2021-01-08 DIAGNOSIS — Z4789 Encounter for other orthopedic aftercare: Secondary | ICD-10-CM | POA: Diagnosis not present

## 2021-01-08 MED ORDER — OXYCODONE HCL 5 MG PO TABS: 5.0000 mg | ORAL_TABLET | ORAL | 0 refills | Status: DC | PRN

## 2021-01-08 NOTE — Telephone Encounter (Signed)
I did send in more oxycodone for her.  Also, she can resume using Advil as needed.

## 2021-01-08 NOTE — Telephone Encounter (Signed)
01/03/21 ov note faxed to [facility]

## 2021-01-08 NOTE — Telephone Encounter (Signed)
I called pt and she would like another refill on her oxycodone. Also wants to know if she can take her Advil again. Please advise

## 2021-01-08 NOTE — Telephone Encounter (Signed)
Patient called she is requesting information regarding a rx she is requesting a call [phone]

## 2021-01-08 NOTE — Telephone Encounter (Signed)
Pt called and advised and stated understanding  

## 2021-01-10 DIAGNOSIS — M25661 Stiffness of right knee, not elsewhere classified: Secondary | ICD-10-CM | POA: Diagnosis not present

## 2021-01-10 DIAGNOSIS — Z4789 Encounter for other orthopedic aftercare: Secondary | ICD-10-CM | POA: Diagnosis not present

## 2021-01-10 DIAGNOSIS — Z96651 Presence of right artificial knee joint: Secondary | ICD-10-CM | POA: Diagnosis not present

## 2021-01-10 DIAGNOSIS — R262 Difficulty in walking, not elsewhere classified: Secondary | ICD-10-CM | POA: Diagnosis not present

## 2021-01-14 DIAGNOSIS — Z96651 Presence of right artificial knee joint: Secondary | ICD-10-CM | POA: Diagnosis not present

## 2021-01-14 DIAGNOSIS — Z4789 Encounter for other orthopedic aftercare: Secondary | ICD-10-CM | POA: Diagnosis not present

## 2021-01-14 DIAGNOSIS — R262 Difficulty in walking, not elsewhere classified: Secondary | ICD-10-CM | POA: Diagnosis not present

## 2021-01-14 DIAGNOSIS — M25661 Stiffness of right knee, not elsewhere classified: Secondary | ICD-10-CM | POA: Diagnosis not present

## 2021-01-17 DIAGNOSIS — Z96651 Presence of right artificial knee joint: Secondary | ICD-10-CM | POA: Diagnosis not present

## 2021-01-17 DIAGNOSIS — M25661 Stiffness of right knee, not elsewhere classified: Secondary | ICD-10-CM | POA: Diagnosis not present

## 2021-01-17 DIAGNOSIS — R262 Difficulty in walking, not elsewhere classified: Secondary | ICD-10-CM | POA: Diagnosis not present

## 2021-01-17 DIAGNOSIS — Z4789 Encounter for other orthopedic aftercare: Secondary | ICD-10-CM | POA: Diagnosis not present

## 2021-01-18 ENCOUNTER — Telehealth: Payer: Self-pay | Admitting: Orthopaedic Surgery

## 2021-01-18 NOTE — Telephone Encounter (Signed)
Lvm informing pt.

## 2021-01-18 NOTE — Telephone Encounter (Signed)
Pt called and states her steri strips are coming off and her skin is really dry. She is wondering what she can put on her skin right there.    CB [phone]

## 2021-01-21 DIAGNOSIS — Z96651 Presence of right artificial knee joint: Secondary | ICD-10-CM | POA: Diagnosis not present

## 2021-01-21 DIAGNOSIS — Z4789 Encounter for other orthopedic aftercare: Secondary | ICD-10-CM | POA: Diagnosis not present

## 2021-01-21 DIAGNOSIS — M25661 Stiffness of right knee, not elsewhere classified: Secondary | ICD-10-CM | POA: Diagnosis not present

## 2021-01-21 DIAGNOSIS — R262 Difficulty in walking, not elsewhere classified: Secondary | ICD-10-CM | POA: Diagnosis not present

## 2021-01-23 ENCOUNTER — Other Ambulatory Visit: Payer: Self-pay | Admitting: Orthopaedic Surgery

## 2021-01-23 ENCOUNTER — Telehealth: Payer: Self-pay | Admitting: Orthopaedic Surgery

## 2021-01-23 MED ORDER — OXYCODONE HCL 5 MG PO TABS: 5.0000 mg | ORAL_TABLET | ORAL | 0 refills | Status: DC | PRN

## 2021-01-23 NOTE — Telephone Encounter (Signed)
Please advise 

## 2021-01-23 NOTE — Telephone Encounter (Signed)
Pt wondering if she can get a refill on oxycodone?

## 2021-01-24 DIAGNOSIS — M25661 Stiffness of right knee, not elsewhere classified: Secondary | ICD-10-CM | POA: Diagnosis not present

## 2021-01-24 DIAGNOSIS — R262 Difficulty in walking, not elsewhere classified: Secondary | ICD-10-CM | POA: Diagnosis not present

## 2021-01-24 DIAGNOSIS — Z96651 Presence of right artificial knee joint: Secondary | ICD-10-CM | POA: Diagnosis not present

## 2021-01-24 DIAGNOSIS — Z4789 Encounter for other orthopedic aftercare: Secondary | ICD-10-CM | POA: Diagnosis not present

## 2021-01-28 DIAGNOSIS — M25661 Stiffness of right knee, not elsewhere classified: Secondary | ICD-10-CM | POA: Diagnosis not present

## 2021-01-28 DIAGNOSIS — Z96651 Presence of right artificial knee joint: Secondary | ICD-10-CM | POA: Diagnosis not present

## 2021-01-28 DIAGNOSIS — Z4789 Encounter for other orthopedic aftercare: Secondary | ICD-10-CM | POA: Diagnosis not present

## 2021-01-28 DIAGNOSIS — R262 Difficulty in walking, not elsewhere classified: Secondary | ICD-10-CM | POA: Diagnosis not present

## 2021-01-30 ENCOUNTER — Ambulatory Visit (INDEPENDENT_AMBULATORY_CARE_PROVIDER_SITE_OTHER): Payer: BC Managed Care – PPO | Admitting: Orthopaedic Surgery

## 2021-01-30 ENCOUNTER — Encounter: Payer: Self-pay | Admitting: Orthopaedic Surgery

## 2021-01-30 DIAGNOSIS — Z96651 Presence of right artificial knee joint: Secondary | ICD-10-CM

## 2021-01-30 NOTE — Progress Notes (Signed)
The patient is now between 5 and 6 weeks status post a right total knee arthroplasty.  Her postoperative course was slowed down by her spinal headache which eventually resolved.  This delayed starting her physical therapy.  She is now walking without a cane.  She is going to physical therapy regularly and wants to extend this for longer and I agree with this as well.  There is some moderate swelling of her right knee which I gave her reassurance this is normal at this amount of time.  Incision looks good.  She can work on massaging her incision and putting any type of aloe's or creams on it for her scar.  Her calf is soft.  She has almost full extension to past 90 degrees flexion.  I would like to see her back in 4 weeks to assess her mobility and range of motion.

## 2021-01-31 DIAGNOSIS — M25661 Stiffness of right knee, not elsewhere classified: Secondary | ICD-10-CM | POA: Diagnosis not present

## 2021-01-31 DIAGNOSIS — Z4789 Encounter for other orthopedic aftercare: Secondary | ICD-10-CM | POA: Diagnosis not present

## 2021-01-31 DIAGNOSIS — Z96651 Presence of right artificial knee joint: Secondary | ICD-10-CM | POA: Diagnosis not present

## 2021-01-31 DIAGNOSIS — R262 Difficulty in walking, not elsewhere classified: Secondary | ICD-10-CM | POA: Diagnosis not present

## 2021-02-06 DIAGNOSIS — Z4789 Encounter for other orthopedic aftercare: Secondary | ICD-10-CM | POA: Diagnosis not present

## 2021-02-06 DIAGNOSIS — R262 Difficulty in walking, not elsewhere classified: Secondary | ICD-10-CM | POA: Diagnosis not present

## 2021-02-06 DIAGNOSIS — M25661 Stiffness of right knee, not elsewhere classified: Secondary | ICD-10-CM | POA: Diagnosis not present

## 2021-02-06 DIAGNOSIS — Z96651 Presence of right artificial knee joint: Secondary | ICD-10-CM | POA: Diagnosis not present

## 2021-02-11 DIAGNOSIS — M25661 Stiffness of right knee, not elsewhere classified: Secondary | ICD-10-CM | POA: Diagnosis not present

## 2021-02-11 DIAGNOSIS — Z4789 Encounter for other orthopedic aftercare: Secondary | ICD-10-CM | POA: Diagnosis not present

## 2021-02-11 DIAGNOSIS — Z96651 Presence of right artificial knee joint: Secondary | ICD-10-CM | POA: Diagnosis not present

## 2021-02-11 DIAGNOSIS — R262 Difficulty in walking, not elsewhere classified: Secondary | ICD-10-CM | POA: Diagnosis not present

## 2021-02-14 DIAGNOSIS — M25661 Stiffness of right knee, not elsewhere classified: Secondary | ICD-10-CM | POA: Diagnosis not present

## 2021-02-14 DIAGNOSIS — R262 Difficulty in walking, not elsewhere classified: Secondary | ICD-10-CM | POA: Diagnosis not present

## 2021-02-14 DIAGNOSIS — Z96651 Presence of right artificial knee joint: Secondary | ICD-10-CM | POA: Diagnosis not present

## 2021-02-14 DIAGNOSIS — Z4789 Encounter for other orthopedic aftercare: Secondary | ICD-10-CM | POA: Diagnosis not present

## 2021-02-17 DIAGNOSIS — R300 Dysuria: Secondary | ICD-10-CM | POA: Diagnosis not present

## 2021-02-18 DIAGNOSIS — R262 Difficulty in walking, not elsewhere classified: Secondary | ICD-10-CM | POA: Diagnosis not present

## 2021-02-18 DIAGNOSIS — Z96651 Presence of right artificial knee joint: Secondary | ICD-10-CM | POA: Diagnosis not present

## 2021-02-18 DIAGNOSIS — M25661 Stiffness of right knee, not elsewhere classified: Secondary | ICD-10-CM | POA: Diagnosis not present

## 2021-02-18 DIAGNOSIS — Z4789 Encounter for other orthopedic aftercare: Secondary | ICD-10-CM | POA: Diagnosis not present

## 2021-02-19 ENCOUNTER — Emergency Department (HOSPITAL_BASED_OUTPATIENT_CLINIC_OR_DEPARTMENT_OTHER): Payer: BC Managed Care – PPO

## 2021-02-19 ENCOUNTER — Other Ambulatory Visit: Payer: Self-pay

## 2021-02-19 ENCOUNTER — Encounter (HOSPITAL_BASED_OUTPATIENT_CLINIC_OR_DEPARTMENT_OTHER): Payer: Self-pay | Admitting: Obstetrics and Gynecology

## 2021-02-19 ENCOUNTER — Emergency Department (HOSPITAL_BASED_OUTPATIENT_CLINIC_OR_DEPARTMENT_OTHER)
Admission: EM | Admit: 2021-02-19 | Discharge: 2021-02-19 | Disposition: A | Discharge status: Home / Self Care | Payer: BC Managed Care – PPO | Attending: Emergency Medicine | Admitting: Emergency Medicine

## 2021-02-19 DIAGNOSIS — R319 Hematuria, unspecified: Secondary | ICD-10-CM | POA: Insufficient documentation

## 2021-02-19 DIAGNOSIS — J45909 Unspecified asthma, uncomplicated: Secondary | ICD-10-CM | POA: Diagnosis not present

## 2021-02-19 DIAGNOSIS — Z96651 Presence of right artificial knee joint: Secondary | ICD-10-CM | POA: Insufficient documentation

## 2021-02-19 DIAGNOSIS — Z85820 Personal history of malignant melanoma of skin: Secondary | ICD-10-CM | POA: Insufficient documentation

## 2021-02-19 DIAGNOSIS — K8020 Calculus of gallbladder without cholecystitis without obstruction: Secondary | ICD-10-CM | POA: Diagnosis not present

## 2021-02-19 DIAGNOSIS — K760 Fatty (change of) liver, not elsewhere classified: Secondary | ICD-10-CM | POA: Diagnosis not present

## 2021-02-19 DIAGNOSIS — K859 Acute pancreatitis without necrosis or infection, unspecified: Secondary | ICD-10-CM | POA: Diagnosis not present

## 2021-02-19 DIAGNOSIS — Z7982 Long term (current) use of aspirin: Secondary | ICD-10-CM | POA: Diagnosis not present

## 2021-02-19 DIAGNOSIS — K5730 Diverticulosis of large intestine without perforation or abscess without bleeding: Secondary | ICD-10-CM | POA: Diagnosis not present

## 2021-02-19 DIAGNOSIS — E039 Hypothyroidism, unspecified: Secondary | ICD-10-CM | POA: Insufficient documentation

## 2021-02-19 DIAGNOSIS — R109 Unspecified abdominal pain: Secondary | ICD-10-CM | POA: Diagnosis not present

## 2021-02-19 DIAGNOSIS — R1030 Lower abdominal pain, unspecified: Secondary | ICD-10-CM | POA: Diagnosis not present

## 2021-02-19 LAB — BASIC METABOLIC PANEL
Anion gap: 10 (ref 5–15)
BUN: 27 mg/dL — ABNORMAL HIGH (ref 8–23)
CO2: 26 mmol/L (ref 22–32)
Calcium: 10.2 mg/dL (ref 8.9–10.3)
Chloride: 104 mmol/L (ref 98–111)
Creatinine, Ser: 0.93 mg/dL (ref 0.44–1.00)
GFR, Estimated: >60 mL/min (ref >60)
Glucose, Bld: 153 mg/dL — ABNORMAL HIGH (ref 70–99)
Potassium: 3.8 mmol/L (ref 3.5–5.1)
Sodium: 140 mmol/L (ref 135–145)

## 2021-02-19 LAB — URINALYSIS, ROUTINE W REFLEX MICROSCOPIC
Bilirubin Urine: NEGATIVE
Glucose, UA: NEGATIVE mg/dL
Ketones, ur: NEGATIVE mg/dL
Leukocytes,Ua: NEGATIVE
Nitrite: NEGATIVE
Protein, ur: NEGATIVE mg/dL
Specific Gravity, Urine: 1.025 (ref 1.005–1.030)
pH: 5 (ref 5.0–8.0)

## 2021-02-19 LAB — CBC
HCT: 39.9 % (ref 36.0–46.0)
Hemoglobin: 12.8 g/dL (ref 12.0–15.0)
MCH: 28.6 pg (ref 26.0–34.0)
MCHC: 32.1 g/dL (ref 30.0–36.0)
MCV: 89.3 fL (ref 80.0–100.0)
Platelets: 425 10*3/uL — ABNORMAL HIGH (ref 150–400)
RBC: 4.47 MIL/uL (ref 3.87–5.11)
RDW: 13.8 % (ref 11.5–15.5)
WBC: 7.1 10*3/uL (ref 4.0–10.5)
nRBC: 0 % (ref 0.0–0.2)

## 2021-02-19 LAB — HEPATIC FUNCTION PANEL
ALT: 27 U/L (ref 0–44)
AST: 19 U/L (ref 15–41)
Albumin: 4.9 g/dL (ref 3.5–5.0)
Alkaline Phosphatase: 73 U/L (ref 38–126)
Bilirubin, Direct: 0.1 mg/dL (ref 0.0–0.2)
Indirect Bilirubin: 0.3 mg/dL (ref 0.3–0.9)
Total Bilirubin: 0.4 mg/dL (ref 0.3–1.2)
Total Protein: 8.1 g/dL (ref 6.5–8.1)

## 2021-02-19 LAB — LIPASE, BLOOD: Lipase: 60 U/L — ABNORMAL HIGH (ref 11–51)

## 2021-02-19 MED ORDER — ONDANSETRON HCL 4 MG PO TABS: 4.0000 mg | ORAL_TABLET | Freq: Three times a day (TID) | ORAL | 0 refills | Status: DC | PRN

## 2021-02-19 MED ORDER — TRAMADOL HCL 50 MG PO TABS
50.0000 mg | ORAL_TABLET | Freq: Once | ORAL | Status: AC
  Administered 2021-02-19: 50 mg via ORAL
  Filled 2021-02-19: qty 1

## 2021-02-19 MED ORDER — TRAMADOL HCL 50 MG PO TABS: 50.0000 mg | ORAL_TABLET | Freq: Four times a day (QID) | ORAL | 0 refills | Status: AC | PRN

## 2021-02-19 MED ORDER — ONDANSETRON 4 MG PO TBDP
4.0000 mg | ORAL_TABLET | Freq: Once | ORAL | Status: AC
  Administered 2021-02-19: 4 mg via ORAL
  Filled 2021-02-19: qty 1

## 2021-02-19 NOTE — Discharge Instructions (Addendum)
You have been seen and discharged from the emergency department.  You were diagnosed with mild pancreatitis.  Your lipase was mildly elevated and there was mild inflammation of the pancreas on the CAT scan without any other complications.  Take nausea and pain medicine as prescribed/needed.  Do not mix this pain medication with alcohol or other sedating medications. Do not drive or do heavy physical activity and to know how this medication affects you.  It may cause drowsiness.  Follow a clear liquid diet for the next 24 hours, you may then advance to a soft/bland/low-fat diet.  Avoid alcohol.  Follow-up with your primary provider for reevaluation and further care. Take home medications as prescribed. If you have any worsening symptoms, worsening abdominal pain, nausea/vomiting, high fevers or further concerns for your health please return to an emergency department for further evaluation.

## 2021-02-19 NOTE — ED Provider Notes (Signed)
[facility] EMERGENCY DEPT Provider Note   CSN: [id number] Arrival date & time: 02/19/21  1507     History Chief Complaint  Patient presents with   Flank Pain    [name] is a 60 y.o. female.  HPI  60 year old female past medical history of kidney stones, HLD presents to the emergency department with concern for hematuria and suprapubic/left flank pain.  Patient states for the past week she has been having suprapubic discomfort.  Had an outpatient UA done, was placed on Macrobid which she completed.  Her suprapubic discomfort and urinary frequency persists.  Prior to arrival she felt like she possibly passed a kidney stone with a grainy sand sensation when she was peeing.  She continues to have dysuria.  Denies any fever.  Over the last day she has developed left-sided abdominal/flank pain.  Denies any fever, nausea/vomiting, diarrhea.  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     No family history on file.  Social History   Tobacco Use   Smoking status:  Never   Smokeless tobacco: Never  Vaping Use   Vaping Use: Never used  Substance Use Topics   Alcohol use: Yes    Comment: Rare wine   Drug use: Never    Home Medications Prior to Admission medications   Medication Sig Start Date End Date Taking? Authorizing Provider  ondansetron (ZOFRAN) 4 MG tablet Take 1 tablet (4 mg total) by mouth every 8 (eight) hours as needed for nausea or vomiting. 02/19/21  Yes Shahara Hartsfield, Alvin Critchley, DO  traMADol (ULTRAM) 50 MG tablet Take 1 tablet (50 mg total) by mouth every 6 (six) hours as needed for up to 5 days. 02/19/21 02/24/21 Yes Lyndel Dancel, Alvin Critchley, DO  albuterol (VENTOLIN HFA) 108 (90 Base) MCG/ACT inhaler Inhale 2 puffs into the lungs every 6 (six) hours as needed for wheezing or shortness of breath.    [provider]  aspirin 81 MG chewable tablet Chew 1 tablet (81 mg total) by mouth 2 (two) times daily. 12/25/20   Mcarthur Rossetti, MD  b complex vitamins capsule Take 1 capsule by mouth daily.    [provider]  butalbital-acetaminophen-caffeine (FIORICET) 50-325-40 MG tablet Take 1 tablet by mouth every 4 (four) hours as needed for headache (PDPH). 12/25/20   Mcarthur Rossetti, MD  cholecalciferol (VITAMIN D3) 25 MCG (1000 UNIT) tablet Take 1,000 Units by mouth daily.  [provider]  Dextran 70-Hypromellose (ARTIFICIAL TEARS) 0.1-0.3 % SOLN Place 1 drop into both eyes daily as needed (dry eyes).    [provider]  fenofibrate 160 MG tablet Take 160 mg by mouth every evening.    [provider]  ibuprofen (ADVIL) 200 MG tablet Take 400-600 mg by mouth every 8 (eight) hours as needed for moderate pain.    [provider]  methocarbamol (ROBAXIN) 500 MG tablet Take 1 tablet (500 mg total) by mouth every 6 (six) hours as needed for muscle spasms. 12/25/20   Mcarthur Rossetti, MD  montelukast (SINGULAIR) 10 MG tablet Take 10 mg by mouth at bedtime.    [provider]  Omega-3  Fatty Acids (FISH OIL) 1000 MG CAPS Take 1,000 mg by mouth daily.    [provider]  pantoprazole (PROTONIX) 20 MG tablet Take 20 mg by mouth every morning.    [provider]  sodium chloride (OCEAN) 0.65 % SOLN nasal spray Place 1 spray into both nostrils as needed for congestion.    [provider]    Allergies    Nickel, Nitrofurantoin, Erythromycin, and Sulfa antibiotics  Review of Systems   Review of Systems  Constitutional:  Negative for chills and fever.  HENT:  Negative for congestion.   Eyes:  Negative for visual disturbance.  Respiratory:  Negative for shortness of breath.   Cardiovascular:  Negative for chest pain.  Gastrointestinal:  Positive for abdominal pain. Negative for blood in stool, diarrhea, nausea and vomiting.  Genitourinary:  Positive for difficulty urinating, flank pain, frequency and hematuria. Negative for dysuria.  Skin:  Negative for rash.  Neurological:  Negative for headaches.   Physical Exam Updated Vital Signs BP (!) 142/86 (BP Location: Right Arm)   Pulse 78   Temp 99 F (37.2 C)   Resp 18   SpO2 100%   Physical Exam Vitals and nursing note reviewed.  Constitutional:      Appearance: Normal appearance.  HENT:     Head: Normocephalic.     Mouth/Throat:     Mouth: Mucous membranes are moist.  Cardiovascular:     Rate and Rhythm: Normal rate.  Pulmonary:     Effort: Pulmonary effort is normal. No respiratory distress.  Abdominal:     Palpations: Abdomen is soft.     Tenderness: There is abdominal tenderness. There is no guarding.     Comments: Mild tenderness to palpation of the left lateral abdomen/flank area  Skin:    General: Skin is warm.  Neurological:     Mental Status: She is alert and oriented to person, place, and time. Mental status is at baseline.  Psychiatric:        Mood and Affect: Mood normal.    ED Results / Procedures / Treatments   Labs (all labs ordered are listed, but only abnormal  results are displayed) Labs Reviewed  URINALYSIS, ROUTINE W REFLEX MICROSCOPIC - Abnormal; Notable for the following components:      Result Value   Hgb urine dipstick SMALL (*)    All other components within normal limits  CBC - Abnormal; Notable for the following components:   Platelets 425 (*)    All other components within normal limits  BASIC METABOLIC PANEL - Abnormal; Notable for the following components:   Glucose, Bld 153 (*)    BUN 27 (*)    All other components within normal limits  LIPASE, BLOOD - Abnormal; Notable for the following components:  Lipase 60 (*)    All other components within normal limits  HEPATIC FUNCTION PANEL    EKG None  Radiology CT Renal Stone Study  Result Date: 02/19/2021 CLINICAL DATA:  Flank pain, kidney stone suspected. UTI. Urgency and frequency. Bladder spasm. EXAM: CT ABDOMEN AND PELVIS WITHOUT CONTRAST TECHNIQUE: Multidetector CT imaging of the abdomen and pelvis was performed following the standard protocol without IV contrast. COMPARISON:  None. FINDINGS: Lower chest: No acute abnormality. Hepatobiliary: The hepatic parenchyma is diffusely hypodense compared to the splenic parenchyma consistent with fatty infiltration. No focal liver abnormality. Contracted gallbladder. Multiple calcified gallstones within the gallbladder lumen. No gallbladder wall thickening or pericholecystic fluid. No biliary dilatation. Pancreas: No focal lesion. Slightly hazy pancreatic contour proximally with associated peripancreatic fat stranding (2:38). No pseudocyst formation. No main pancreatic ductal dilatation. Spleen: Normal in size without focal abnormality. Adrenals/Urinary Tract: No adrenal nodule bilaterally. No nephrolithiasis, no hydronephrosis, and no contour-deforming renal mass. No ureterolithiasis or hydroureter. The urinary bladder is unremarkable. Stomach/Bowel: Stomach is within normal limits. No evidence of bowel wall thickening or dilatation. Few  scattered sigmoid diverticula. Appendix appears normal. Vascular/Lymphatic: No abdominal aorta or iliac aneurysm. No abdominal, pelvic, or inguinal lymphadenopathy. Reproductive: Status post hysterectomy. No adnexal masses. Other: No intraperitoneal free fluid. No intraperitoneal free gas. No organized fluid collection. Musculoskeletal: No abdominal wall hernia or abnormality. No suspicious lytic or blastic osseous lesions. No acute displaced fracture. Multilevel degenerative changes of the spine. IMPRESSION: 1. Findings suggestive of acute pancreatitis. Correlate with lipase levels. 2. Cholelithiasis in the setting of a contracted gallbladder. 3. Hepatic steatosis. 4. Scattered sigmoid diverticulosis with no acute diverticulitis. Electronically Signed   By: Iven Finn M.D.   On: 02/19/2021 18:45    Procedures Procedures   Medications Ordered in ED Medications  traMADol (ULTRAM) tablet 50 mg (50 mg Oral Given 02/19/21 2034)  ondansetron (ZOFRAN-ODT) disintegrating tablet 4 mg (4 mg Oral Given 02/19/21 2034)    ED Course  I have reviewed the triage vital signs and the nursing notes.  Pertinent labs & imaging results that were available during my care of the patient were reviewed by me and considered in my medical decision making (see chart for details).    MDM Rules/Calculators/A&P                           60 year old female presents the emergency department with suprapubic discomfort, dysuria, urinary frequency and to the left lateral abdominal/flank pain.  Vitals are normal and stable.  Blood work is reassuring with an elevated lipase.  Urinalysis has small amount of blood in it.  CT renal study shows stranding around the pancreas consistent with acute pancreatitis.  There is also cholelithiasis without any signs of acute cholecystitis/inflammation.  Patient has follow-up with urology in the next 2 days for her urinary symptoms, I believe that this is appropriate.  I believe her left  upper abdominal pain is being caused by pancreatitis.  She is a Ranson score of 1 just for age but otherwise appears very appropriate for outpatient mild pancreatitis treatment with strict return to ED precautions.  Patient at this time appears safe and stable for discharge and will be treated as an outpatient.  Discharge plan and strict return to ED precautions discussed, patient verbalizes understanding and agreement.  Final Clinical Impression(s) / ED Diagnoses Final diagnoses:  Acute pancreatitis, unspecified complication status, unspecified pancreatitis type    Rx / DC Orders ED Discharge  Orders          Ordered    traMADol (ULTRAM) 50 MG tablet  Every 6 hours PRN        02/19/21 2020    ondansetron (ZOFRAN) 4 MG tablet  Every 8 hours PRN        02/19/21 2020             Mearl Olver, Alvin Critchley, DO 02/19/21 2046

## 2021-02-19 NOTE — ED Triage Notes (Signed)
Patient reports on Thursday of last week she had a bladder spasm. Patient was concerned about a UTI/flank pain. Patient reports urgency and frequency.

## 2021-02-21 DIAGNOSIS — N130 Hydronephrosis with ureteropelvic junction obstruction: Secondary | ICD-10-CM | POA: Diagnosis not present

## 2021-02-21 DIAGNOSIS — N201 Calculus of ureter: Secondary | ICD-10-CM | POA: Diagnosis not present

## 2021-02-22 DIAGNOSIS — Z87442 Personal history of urinary calculi: Secondary | ICD-10-CM | POA: Diagnosis not present

## 2021-02-22 DIAGNOSIS — K859 Acute pancreatitis without necrosis or infection, unspecified: Secondary | ICD-10-CM | POA: Diagnosis not present

## 2021-02-25 DIAGNOSIS — Z4789 Encounter for other orthopedic aftercare: Secondary | ICD-10-CM | POA: Diagnosis not present

## 2021-02-25 DIAGNOSIS — R262 Difficulty in walking, not elsewhere classified: Secondary | ICD-10-CM | POA: Diagnosis not present

## 2021-02-25 DIAGNOSIS — Z96651 Presence of right artificial knee joint: Secondary | ICD-10-CM | POA: Diagnosis not present

## 2021-02-25 DIAGNOSIS — M25661 Stiffness of right knee, not elsewhere classified: Secondary | ICD-10-CM | POA: Diagnosis not present

## 2021-02-27 ENCOUNTER — Ambulatory Visit (INDEPENDENT_AMBULATORY_CARE_PROVIDER_SITE_OTHER): Payer: BC Managed Care – PPO | Admitting: Orthopaedic Surgery

## 2021-02-27 ENCOUNTER — Encounter: Payer: Self-pay | Admitting: Orthopaedic Surgery

## 2021-02-27 DIAGNOSIS — Z96651 Presence of right artificial knee joint: Secondary | ICD-10-CM

## 2021-02-27 DIAGNOSIS — K8020 Calculus of gallbladder without cholecystitis without obstruction: Secondary | ICD-10-CM | POA: Diagnosis not present

## 2021-02-27 DIAGNOSIS — N200 Calculus of kidney: Secondary | ICD-10-CM | POA: Diagnosis not present

## 2021-02-27 NOTE — Progress Notes (Signed)
The patient is now just over a month status post a right total knee arthroplasty.  She is off of pain medications and walking more than a mile every day.  She reports good progress to outpatient physical therapy.  She was recently hospitalized due to acute pancreatitis.  They also found a kidney stone.  She is now feeling much better overall and her liver tests have improved.  She is seeing a GI specialist soon.  She says her knee is doing very well.  Examination of her right operative knee shows almost full range of motion.  The knee is ligamentously stable and has a mild to moderate swelling to be expected.  On my standpoint she can stop outpatient physical therapy and can just perform a home exercise program.  She is doing well overall and is off all pain medications.  All questions and concerns were answered and addressed.  I would like to see her back in 6 months for AP and lateral of her right knee.  If there are issues before then she knows to let us know.

## 2021-03-08 ENCOUNTER — Other Ambulatory Visit: Payer: Self-pay | Admitting: Family Medicine

## 2021-03-08 ENCOUNTER — Other Ambulatory Visit: Payer: Self-pay | Admitting: Nurse Practitioner

## 2021-03-08 DIAGNOSIS — Z1231 Encounter for screening mammogram for malignant neoplasm of breast: Secondary | ICD-10-CM

## 2021-03-22 DIAGNOSIS — Z85828 Personal history of other malignant neoplasm of skin: Secondary | ICD-10-CM | POA: Diagnosis not present

## 2021-03-22 DIAGNOSIS — D2261 Melanocytic nevi of right upper limb, including shoulder: Secondary | ICD-10-CM | POA: Diagnosis not present

## 2021-03-22 DIAGNOSIS — D2262 Melanocytic nevi of left upper limb, including shoulder: Secondary | ICD-10-CM | POA: Diagnosis not present

## 2021-03-22 DIAGNOSIS — D225 Melanocytic nevi of trunk: Secondary | ICD-10-CM | POA: Diagnosis not present

## 2021-03-22 DIAGNOSIS — D485 Neoplasm of uncertain behavior of skin: Secondary | ICD-10-CM | POA: Diagnosis not present

## 2021-03-22 DIAGNOSIS — L905 Scar conditions and fibrosis of skin: Secondary | ICD-10-CM | POA: Diagnosis not present

## 2021-03-25 DIAGNOSIS — N201 Calculus of ureter: Secondary | ICD-10-CM | POA: Diagnosis not present

## 2021-03-25 DIAGNOSIS — N130 Hydronephrosis with ureteropelvic junction obstruction: Secondary | ICD-10-CM | POA: Diagnosis not present

## 2021-04-10 ENCOUNTER — Telehealth: Payer: Self-pay | Admitting: Orthopaedic Surgery

## 2021-04-10 NOTE — Telephone Encounter (Signed)
LMOM for patient letting her know that antibiotics not needed at this point for dental appts

## 2021-04-10 NOTE — Telephone Encounter (Signed)
Pt called requesting meds for her upcoming dental appt. Pt has knee replacement 3 months ago. Please send antibiotics to pharmacy on file [facility]. Please call pt at [phone].

## 2021-04-22 DIAGNOSIS — Z87442 Personal history of urinary calculi: Secondary | ICD-10-CM | POA: Diagnosis not present

## 2021-04-22 DIAGNOSIS — K449 Diaphragmatic hernia without obstruction or gangrene: Secondary | ICD-10-CM | POA: Diagnosis not present

## 2021-04-22 DIAGNOSIS — N1330 Unspecified hydronephrosis: Secondary | ICD-10-CM | POA: Diagnosis not present

## 2021-04-22 DIAGNOSIS — K8020 Calculus of gallbladder without cholecystitis without obstruction: Secondary | ICD-10-CM | POA: Diagnosis not present

## 2021-04-22 DIAGNOSIS — N281 Cyst of kidney, acquired: Secondary | ICD-10-CM | POA: Diagnosis not present

## 2021-04-29 ENCOUNTER — Other Ambulatory Visit: Payer: Self-pay

## 2021-04-29 ENCOUNTER — Ambulatory Visit
Admission: RE | Admit: 2021-04-29 | Discharge: 2021-04-29 | Disposition: A | Discharge status: Home / Self Care | Payer: BC Managed Care – PPO | Source: Ambulatory Visit | Attending: Nurse Practitioner | Admitting: Nurse Practitioner

## 2021-04-29 DIAGNOSIS — Z1231 Encounter for screening mammogram for malignant neoplasm of breast: Secondary | ICD-10-CM

## 2021-05-19 DIAGNOSIS — Z03818 Encounter for observation for suspected exposure to other biological agents ruled out: Secondary | ICD-10-CM | POA: Diagnosis not present

## 2021-05-19 DIAGNOSIS — J029 Acute pharyngitis, unspecified: Secondary | ICD-10-CM | POA: Diagnosis not present

## 2021-05-19 DIAGNOSIS — B349 Viral infection, unspecified: Secondary | ICD-10-CM | POA: Diagnosis not present

## 2021-05-19 DIAGNOSIS — J45909 Unspecified asthma, uncomplicated: Secondary | ICD-10-CM | POA: Diagnosis not present

## 2021-05-21 DIAGNOSIS — J069 Acute upper respiratory infection, unspecified: Secondary | ICD-10-CM | POA: Diagnosis not present

## 2021-05-21 DIAGNOSIS — J4521 Mild intermittent asthma with (acute) exacerbation: Secondary | ICD-10-CM | POA: Diagnosis not present

## 2021-06-04 DIAGNOSIS — J4540 Moderate persistent asthma, uncomplicated: Secondary | ICD-10-CM | POA: Diagnosis not present

## 2021-06-04 DIAGNOSIS — R053 Chronic cough: Secondary | ICD-10-CM | POA: Diagnosis not present

## 2021-06-13 ENCOUNTER — Ambulatory Visit (INDEPENDENT_AMBULATORY_CARE_PROVIDER_SITE_OTHER): Payer: BC Managed Care – PPO | Admitting: Internal Medicine

## 2021-06-13 ENCOUNTER — Encounter: Payer: Self-pay | Admitting: Internal Medicine

## 2021-06-13 ENCOUNTER — Other Ambulatory Visit: Payer: Self-pay

## 2021-06-13 VITALS — BP 116/86 | HR 78 | Temp 98.7°F | Ht 66.0 in | Wt 204.0 lb

## 2021-06-13 DIAGNOSIS — J301 Allergic rhinitis due to pollen: Secondary | ICD-10-CM | POA: Diagnosis not present

## 2021-06-13 DIAGNOSIS — K219 Gastro-esophageal reflux disease without esophagitis: Secondary | ICD-10-CM

## 2021-06-13 DIAGNOSIS — J4540 Moderate persistent asthma, uncomplicated: Secondary | ICD-10-CM

## 2021-06-13 LAB — CBC WITH DIFFERENTIAL/PLATELET
Basophils Absolute: 0 10*3/uL (ref 0.0–0.1)
Basophils Relative: 0.8 % (ref 0.0–3.0)
Eosinophils Absolute: 0.1 10*3/uL (ref 0.0–0.7)
Eosinophils Relative: 2.4 % (ref 0.0–5.0)
HCT: 39.7 % (ref 36.0–46.0)
Hemoglobin: 13 g/dL (ref 12.0–15.0)
Lymphocytes Relative: 38.2 % (ref 12.0–46.0)
Lymphs Abs: 2.1 10*3/uL (ref 0.7–4.0)
MCHC: 32.6 g/dL (ref 30.0–36.0)
MCV: 87.2 fl (ref 78.0–100.0)
Monocytes Absolute: 0.6 10*3/uL (ref 0.1–1.0)
Monocytes Relative: 10.4 % (ref 3.0–12.0)
Neutro Abs: 2.7 10*3/uL (ref 1.4–7.7)
Neutrophils Relative %: 48.2 % (ref 43.0–77.0)
Platelets: 419 10*3/uL — ABNORMAL HIGH (ref 150.0–400.0)
RBC: 4.55 Mil/uL (ref 3.87–5.11)
RDW: 14.5 % (ref 11.5–15.5)
WBC: 5.5 10*3/uL (ref 4.0–10.5)

## 2021-06-13 LAB — POCT EXHALED NITRIC OXIDE: FeNO level (ppb): 14

## 2021-06-13 MED ORDER — PANTOPRAZOLE SODIUM 40 MG PO TBEC: 40.0000 mg | DELAYED_RELEASE_TABLET | Freq: Every day | ORAL | 5 refills | Status: DC

## 2021-06-13 MED ORDER — SPACER/AERO-HOLDING CHAMBERS DEVI: 0 refills | Status: AC

## 2021-06-13 MED ORDER — PANTOPRAZOLE SODIUM 40 MG PO TBEC: 40.0000 mg | DELAYED_RELEASE_TABLET | Freq: Two times a day (BID) | ORAL | 5 refills | Status: DC

## 2021-06-13 NOTE — Progress Notes (Signed)
[name]    [id number]    [birth year]  Primary Care Physician:Daniel, Thayer Headings, NP  Referring Physician: Carolee Rota, NP [address]  [place] [address] Reason for Consultation: asthma Date of Consultation: 06/13/2021  Chief complaint:   Chief Complaint  Patient presents with   Consult    Asthma, coughing for past month     HPI: [name] is a 60 y.o. woman with history of asthma. She was diagnosed with asthma in her late 38s. Symptoms were under poor control when she was having children. Her symptoms improved greatly during covid.  However things got worse about a month ago.  Recent urgent care visit and was seen for URI. Treated with steroids and antibiotics  Seen again a few days later by PCP when symptoms returned and then treated with augmentin and prednisone as well as a decadron injection. She did get better and now that she's been off the prednisone taper she is worse again.  Went back to PCP who started on advair and gabapentin.  Gabapentin makes her feel dizzy.  Advair seems to be helping her breathing.   She wakes up in the middle of the night with a spasmodic cough associated with shortness of breath.   Feels better with hot tea and humidification. Spicy foods makes the coughing worse. Eating and talking can make her coughing worse.   Taking albuterol inhaler 1-2 times/day which is down from 3-4 times/day She does not have a spacer but has used one in the past and this helped her.    Current Regimen: advair hfa 115 2 puffs BID. singulair (doesn't like the diskus, makes her cough more) Asthma Triggers:  smoke, vinegar, spicy foods, molds, seasonal allergies.  Exacerbations in the last year: 3 requiring prednisone History of hospitalization or intubation: never Allergy Testing: GERD: yes on protonix 20 mg once daily Allergic Rhinitis: ACT:  Asthma Control Test ACT Total Score  06/13/2021 9   FeNO: 14 ppb obtained 11/10  Social  history:  Social History   Occupational History   Not on file  Tobacco Use   Smoking status: Never   Smokeless tobacco: Never  Vaping Use   Vaping Use: Never used  Substance and Sexual Activity   Alcohol use: Yes    Comment: Rare wine   Drug use: Never   Sexual activity: Not on file    Relevant family history:  Family History  Problem Relation Age of Onset   Lung disease Neg Hx     Past Medical History:  Diagnosis Date   Arthritis    Asthma    Endometriosis    GERD (gastroesophageal reflux disease)    History of kidney stones    History of migraine headaches    Hyperlipidemia    Hypothyroidism    Insomnia    Pneumonia    PONV (postoperative nausea and vomiting)    Skin cancer    Face    Past Surgical History:  Procedure Laterality Date   ABDOMINAL HYSTERECTOMY     AUGMENTATION MAMMAPLASTY Bilateral    LAPAROSCOPY     SKIN CANCER EXCISION     TOTAL KNEE ARTHROPLASTY Right 12/21/2020   Procedure: RIGHT TOTAL KNEE ARTHROPLASTY;  Surgeon: Mcarthur Rossetti, MD;  Location: [facility];  Service: Orthopedics;  Laterality: Right;   URETHRAL DILATION       Physical Exam: Blood pressure 116/86, pulse 78, temperature 98.7 F (37.1 C), temperature source Oral, height  5\' 6"  (1.676 m), weight 204 lb (92.5 kg), SpO2 98 %. Gen:      No acute distress, hoarse voice ENT:  +cobblestoning, no nasal polyps, mucus membranes moist Lungs:    No increased respiratory effort, symmetric chest wall excursion, clear to auscultation bilaterally, no wheezes or crackles, frequent coughing CV:         Regular rate and rhythm; no murmurs, rubs, or gallops.  No pedal edema Abd:      + bowel sounds; soft, non-tender; no distension MSK: no acute synovitis of DIP or PIP joints, no mechanics hands.  Skin:      Warm and dry; no rashes Neuro: normal speech, no focal facial asymmetry Psych: alert and oriented x3, normal mood and affect   Data Reviewed/Medical Decision  Making:  Independent interpretation of tests: Imaging:  Review of patient's lung windows on ct renal stone protocol July 2022 images revealed no acute pulmonary process. The patient's images have been independently reviewed by me.    PFTs: I have personally reviewed the patient's PFTs and spirometry ordered and performed, but study did not meet ATS criteria, patient couldn't complete without coughing No flowsheet data found.  Labs:  Lab Results  Component Value Date   WBC 7.1 02/19/2021   HGB 12.8 02/19/2021   HCT 39.9 02/19/2021   MCV 89.3 02/19/2021   PLT 425 (H) 02/19/2021   Lab Results  Component Value Date   NA 140 02/19/2021   K 3.8 02/19/2021   CL 104 02/19/2021   CO2 26 02/19/2021     Immunization status:  Immunization History  Administered Date(s) Administered   Influenza Split 05/28/2012, 04/09/2016   Influenza, Seasonal, Injecte, Preservative Fre 06/18/2015   Influenza,inj,quad, With Preservative 05/04/2018   Influenza-Unspecified 05/06/2021   PFIZER(Purple Top)SARS-COV-2 Vaccination 10/06/2019, 11/02/2019, 05/07/2020, 12/05/2020   PPD Test 08/04/2008   Pneumococcal-Unspecified 02/22/2010   Tdap 05/28/2012   Zoster Recombinat (Shingrix) 03/18/2021     I reviewed prior external note(s) from PCP, urgent care  I reviewed the result(s) of the labs and imaging as noted above.   I have ordered spirometry and Feno   Assessment:  Moderate Persistent asthma with worsening control GERD Chronic Cough Allergic Rhinitis  Plan/Recommendations:  [name] has worsening symptoms of asthma but while her dyspnea and bronchospasm has improved with steroids and initiation of ICS, still has cough.   Given nocturnal worsening and worsening with foods, I think her cough is most likely related to uncontrolled reflux. I'd like her to increase her protonix to 40 mg bid for a few weeks to get control of onngoing LPR which is causing the cough spasming. We can turn this down  later.   Will not increase her steroids further as it has only been a couple weeks on advair and feno was low.  Continue singulair.  Continue advair. Will prescribe spacer.   Will obtain blood work to risk stratify her asthma - IgE, Eos, region 2 allergy panel  We discussed disease management and progression at length today.      Return to Care: Return in about 4 weeks (around 07/11/2021).  Lenice Llamas, MD Pulmonary and [place]  CC: Carolee Rota, NP

## 2021-06-13 NOTE — Patient Instructions (Addendum)
Please schedule follow up scheduled with myself in 1 months.  If my schedule is not open yet, we will contact you with a reminder closer to that time.  Continue albuterol as needed.  Continue advair 2 puffs twice a day  Increase your protonix to 40 mg twice a day. I think ongoing reflux cycle is making your current cough worse.  Cut back on caffeine and other trigger foods for reflux.  Continue singulair  What is GERD? Gastroesophageal reflux disease (GERD) is gastroesophageal reflux diseasewhich occurs when the lower esophageal sphincter (LES) opens spontaneously, for varying periods of time, or does not close properly and stomach contents rise up into the esophagus. GER is also called acid reflux or acid regurgitation, because digestive juices--called acids--rise up with the food. The esophagus is the tube that carries food from the mouth to the stomach. The LES is a ring of muscle at the bottom of the esophagus that acts like a valve between the esophagus and stomach.  When acid reflux occurs, food or fluid can be tasted in the back of the mouth. When refluxed stomach acid touches the lining of the esophagus it may cause a burning sensation in the chest or throat called heartburn or acid indigestion. Occasional reflux is common. Persistent reflux that occurs more than twice a week is considered GERD, and it can eventually lead to more serious health problems. People of all ages can have GERD. Studies have shown that GERD may worsen or contribute to asthma, chronic cough, and pulmonary fibrosis.   What are the symptoms of GERD? The main symptom of GERD in adults is frequent heartburn, also called acid indigestion--burning-type pain in the lower part of the mid-chest, behind the breast bone, and in the mid-abdomen.  Not all reflux is acidic in nature, and many patients don't have heart burn at all. Sometimes it feels like a cough (either dry or with mucus), choking sensation, asthma, shortness of  breath, waking up at night, frequent throat clearing, or trouble swallowing.    What causes GERD? The reason some people develop GERD is still unclear. However, research shows that in people with GERD, the LES relaxes while the rest of the esophagus is working. Anatomical abnormalities such as a hiatal hernia may also contribute to GERD. A hiatal hernia occurs when the upper part of the stomach and the LES move above the diaphragm, the muscle wall that separates the stomach from the chest. Normally, the diaphragm helps the LES keep acid from rising up into the esophagus. When a hiatal hernia is present, acid reflux can occur more easily. A hiatal hernia can occur in people of any age and is most often a normal finding in otherwise healthy people over age 46. Most of the time, a hiatal hernia produces no symptoms.   Other factors that may contribute to GERD include - Obesity or recent weight gain - Pregnancy  - Smoking  - Diet - Certain medications  Common foods that can worsen reflux symptoms include: - carbonated beverages - artificial sweeteners - citrus fruits  - chocolate  - drinks with caffeine or alcohol  - fatty and fried foods  - garlic and onions  - mint flavorings  - spicy foods  - tomato-based foods, like spaghetti sauce, salsa, chili, and pizza   Lifestyle Changes If you smoke, stop.  Avoid foods and beverages that worsen symptoms (see above.) Lose weight if needed.  Eat small, frequent meals.  Wear loose-fitting clothes.  Avoid lying down for  3 hours after a meal.  Raise the head of your bed 6 to 8 inches by securing wood blocks under the bedposts. Just using extra pillows will not help, but using a wedge-shaped pillow may be helpful.  Medications  H2 blockers, such as cimetidine (Tagamet HB), famotidine (Pepcid AC), nizatidine (Axid AR), and ranitidine (Zantac 75), decrease acid production. They are available in prescription strength and over-the-counter strength.  These drugs provide short-term relief and are effective for about half of those who have GERD symptoms.  Proton pump inhibitors include omeprazole (Prilosec, Zegerid), lansoprazole (Prevacid), pantoprazole (Protonix), rabeprazole (Aciphex), and esomeprazole (Nexium), which are available by prescription. Prilosec is also available in over-the-counter strength. Proton pump inhibitors are more effective than H2 blockers and can relieve symptoms and heal the esophageal lining in almost everyone who has GERD.  Because drugs work in different ways, combinations of medications may help control symptoms. People who get heartburn after eating may take both antacids and H2 blockers. The antacids work first to neutralize the acid in the stomach, and then the H2 blockers act on acid production. By the time the antacid stops working, the H2 blocker will have stopped acid production. Your health care provider is the best source of information about how to use medications for GERD.   Points to Remember 1. You can have GERD without having heartburn. Your symptoms could include a dry cough, asthma symptoms, or trouble swallowing.  2. Taking medications daily as prescribed is important in controlling you symptoms.  Sometimes it can take up to 8 weeks to fully achieve the effects of the medications prescribed.  3. Coughing related to GERD can be difficult to treat and is very frustrating!  However, it is important to stick with these medications and lifestyle modifications before pursuing more aggressive or invasive test and treatments.    By learning about asthma and how it can be controlled, you take an important step toward managing this disease. Work closely with your asthma care team to learn all you can about your asthma, how to avoid triggers, what your medications do, and how to take them correctly. With proper care, you can live free of asthma symptoms and maintain a normal, healthy lifestyle.   What is  asthma? Asthma is a chronic disease that affects the airways of the lungs. During normal breathing, the bands of muscle that surround the airways are relaxed and air moves freely. During an asthma episode or "attack," there are three main changes that stop air from moving easily through the airways: The bands of muscle that surround the airways tighten and make the airways narrow. This tightening is called bronchospasm.  The lining of the airways becomes swollen or inflamed.  The cells that line the airways produce more mucus, which is thicker than normal and clogs the airways.  These three factors - bronchospasm, inflammation, and mucus production - cause symptoms such as difficulty breathing, wheezing, and coughing.  What are the most common symptoms of asthma? Asthma symptoms are not the same for everyone. They can even change from episode to episode in the same person. Also, you may have only one symptom of asthma, such as cough, but another person may have all the symptoms of asthma. It is important to know all the symptoms of asthma and to be aware that your asthma can present in any of these ways at any time. The most common symptoms include: Coughing, especially at night  Shortness of breath  Wheezing  Chest tightness, pain,  or pressure   Who is affected by asthma? Asthma affects 22 million Americans; about 6 million of these are children under age 92. People who have a family history of asthma have an increased risk of developing the disease. Asthma is also more common in people who have allergies or who are exposed to tobacco smoke. However, anyone can develop asthma at any time. Some people may have asthma all of their lives, while others may develop it as adults.  What causes asthma? The airways in a person with asthma are very sensitive and react to many things, or "triggers." Contact with these triggers causes asthma symptoms. One of the most important parts of asthma control is to  identify your triggers and then avoid them when possible. The only trigger you do not want to avoid is exercise. Pre-treatment with medicines before exercise can allow you to stay active yet avoid asthma symptoms. Common asthma triggers include: Infections (colds, viruses, flu, sinus infections)  Exercise  Weather (changes in temperature and/or humidity, cold air)  Tobacco smoke  Allergens (dust mites, pollens, pets, mold spores, cockroaches, and sometimes foods)  Irritants (strong odors from cleaning products, perfume, wood smoke, air pollution)  Strong emotions such as crying or laughing hard  Some medications   How is asthma diagnosed? To diagnose asthma, your doctor will first review your medical history, family history, and symptoms. Your doctor will want to know any past history of breathing problems you may have had, as well as a family history of asthma, allergies, eczema (a bumpy, itchy skin rash caused by allergies), or other lung disease. It is important that you describe your symptoms in detail (cough, wheeze, shortness of breath, chest tightness), including when and how often they occur. The doctor will perform a physical examination and listen to your heart and lungs. He or she may also order breathing tests, allergy tests, blood tests, and chest and sinus X-rays. The tests will find out if you do have asthma and if there are any other conditions that are contributing factors.  How is asthma treated? Asthma can be controlled, but not cured. It is not normal to have frequent symptoms, trouble sleeping, or trouble completing tasks. Appropriate asthma care will prevent symptoms and visits to the emergency room and hospital. Asthma medicines are one of the mainstays of asthma treatment. The drugs used to treat asthma are explained below.  Anti-inflammatories: These are the most important drugs for most people with asthma. Anti-inflammatory drugs reduce swelling and mucus production in the  airways. As a result, airways are less sensitive and less likely to react to triggers. These medications need to be taken daily and may need to be taken for several weeks before they begin to control asthma. Anti-inflammatory medicines lead to fewer symptoms, better airflow, less sensitive airways, less airway damage, and fewer asthma attacks. If taken every day, they CONTROL or prevent asthma symptoms.   Bronchodilators: These drugs relax the muscle bands that tighten around the airways. This action opens the airways, letting more air in and out of the lungs and improving breathing. Bronchodilators also help clear mucus from the lungs. As the airways open, the mucus moves more freely and can be coughed out more easily. In short-acting forms, bronchodilators RELIEVE or stop asthma symptoms by quickly opening the airways and are very helpful during an asthma episode. In long-acting forms, bronchodilators provide CONTROL of asthma symptoms and prevent asthma episodes.  Asthma drugs can be taken in a variety of ways. Inhaling  the medications by using a metered dose inhaler, dry powder inhaler, or nebulizer is one way of taking asthma medicines. Oral medicines (pills or liquids you swallow) may also be prescribed.  Asthma severity Asthma is classified as either "intermittent" (comes and goes) or "persistent" (lasting). Persistent asthma is further described as being mild, moderate, or severe. The severity of asthma is based on how often you have symptoms both during the day and night, as well as by the results of lung function tests and by how well you can perform activities. The "severity" of asthma refers to how "intense" or "strong" your asthma is.  Asthma control Asthma control is the goal of asthma treatment. Regardless of your asthma severity, it may or may not be controlled. Asthma control means: You are able to do everything you want to do at work and home  You have no (or minimal) asthma symptoms   You do not wake up from your sleep or earlier than usual in the morning due to asthma  You rarely need to use your reliever medicine (inhaler)  Another major part of your treatment is that you are happy with your asthma care and believe your asthma is controlled.  Monitoring symptoms A key part of treatment is keeping track of how well your lungs are working. Monitoring your symptoms  what they are, how and when they happen, and how severe they are  is an important part of being able to control your asthma.  Sometimes asthma is monitored using a peak flow meter. A peak flow (PF) meter measures how fast the air comes out of your lungs. It can help you know when your asthma is getting worse, sometimes even before you have symptoms. By taking daily peak flow readings, you can learn when to adjust medications to keep asthma under good control. It is also used to create your asthma action plan (see below). Your doctor can use your peak flow readings to adjust your treatment plan in some cases.  Asthma Action Plan Based on your history and asthma severity, you and your doctor will develop a care plan called an "asthma action plan." The asthma action plan describes when and how to use your medicines, actions to take when asthma worsens, and when to seek emergency care. Make sure you understand this plan. If you do not, ask your asthma care provider any questions you may have. Your asthma action plan is one of the keys to controlling asthma. Keep it readily available to remind you of what you need to do every day to control asthma and what you need to do when symptoms occur.  Goals of asthma therapy These are the goals of asthma treatment: Live an active, normal life  Prevent chronic and troublesome symptoms  Attend work or school every day  Perform daily activities without difficulty  Stop urgent visits to the doctor, emergency department, or hospital  Use and adjust medications to control asthma  with few or no side effects

## 2021-06-14 LAB — RESPIRATORY ALLERGY PROFILE REGION II ~~LOC~~

## 2021-06-14 LAB — INTERPRETATION:

## 2021-07-12 DIAGNOSIS — J4540 Moderate persistent asthma, uncomplicated: Secondary | ICD-10-CM | POA: Diagnosis not present

## 2021-07-12 DIAGNOSIS — E7800 Pure hypercholesterolemia, unspecified: Secondary | ICD-10-CM | POA: Diagnosis not present

## 2021-07-12 DIAGNOSIS — R7303 Prediabetes: Secondary | ICD-10-CM | POA: Diagnosis not present

## 2021-07-12 DIAGNOSIS — I10 Essential (primary) hypertension: Secondary | ICD-10-CM | POA: Diagnosis not present

## 2021-07-12 DIAGNOSIS — E669 Obesity, unspecified: Secondary | ICD-10-CM | POA: Diagnosis not present

## 2021-07-18 ENCOUNTER — Ambulatory Visit (INDEPENDENT_AMBULATORY_CARE_PROVIDER_SITE_OTHER): Payer: BC Managed Care – PPO | Admitting: Internal Medicine

## 2021-07-18 ENCOUNTER — Other Ambulatory Visit: Payer: Self-pay

## 2021-07-18 ENCOUNTER — Encounter: Payer: Self-pay | Admitting: Internal Medicine

## 2021-07-18 VITALS — BP 122/74 | HR 81 | Ht 66.0 in | Wt 206.0 lb

## 2021-07-18 DIAGNOSIS — J4540 Moderate persistent asthma, uncomplicated: Secondary | ICD-10-CM

## 2021-07-18 NOTE — Patient Instructions (Signed)
Please schedule follow up scheduled with myself in 3 months.  If my schedule is not open yet, we will contact you with a reminder closer to that time. Please call [phone] if you haven't heard from [place] a month before.   What is GERD? Gastroesophageal reflux disease (GERD) is gastroesophageal reflux diseasewhich occurs when the lower esophageal sphincter (LES) opens spontaneously, for varying periods of time, or does not close properly and stomach contents rise up into the esophagus. GER is also called acid reflux or acid regurgitation, because digestive juices--called acids--rise up with the food. The esophagus is the tube that carries food from the mouth to the stomach. The LES is a ring of muscle at the bottom of the esophagus that acts like a valve between the esophagus and stomach.  When acid reflux occurs, food or fluid can be tasted in the back of the mouth. When refluxed stomach acid touches the lining of the esophagus it may cause a burning sensation in the chest or throat called heartburn or acid indigestion. Occasional reflux is common. Persistent reflux that occurs more than twice a week is considered GERD, and it can eventually lead to more serious health problems. People of all ages can have GERD. Studies have shown that GERD may worsen or contribute to asthma, chronic cough, and pulmonary fibrosis.   What are the symptoms of GERD? The main symptom of GERD in adults is frequent heartburn, also called acid indigestion--burning-type pain in the lower part of the mid-chest, behind the breast bone, and in the mid-abdomen.  Not all reflux is acidic in nature, and many patients don't have heart burn at all. Sometimes it feels like a cough (either dry or with mucus), choking sensation, asthma, shortness of breath, waking up at night, frequent throat clearing, or trouble swallowing.    What causes GERD? The reason some people develop GERD is still unclear. However, research shows that in  people with GERD, the LES relaxes while the rest of the esophagus is working. Anatomical abnormalities such as a hiatal hernia may also contribute to GERD. A hiatal hernia occurs when the upper part of the stomach and the LES move above the diaphragm, the muscle wall that separates the stomach from the chest. Normally, the diaphragm helps the LES keep acid from rising up into the esophagus. When a hiatal hernia is present, acid reflux can occur more easily. A hiatal hernia can occur in people of any age and is most often a normal finding in otherwise healthy people over age 48. Most of the time, a hiatal hernia produces no symptoms.   Other factors that may contribute to GERD include - Obesity or recent weight gain - Pregnancy  - Smoking  - Diet - Certain medications  Common foods that can worsen reflux symptoms include: - carbonated beverages - artificial sweeteners - citrus fruits  - chocolate  - drinks with caffeine or alcohol  - fatty and fried foods  - garlic and onions  - mint flavorings  - spicy foods  - tomato-based foods, like spaghetti sauce, salsa, chili, and pizza   Lifestyle Changes If you smoke, stop.  Avoid foods and beverages that worsen symptoms (see above.) Lose weight if needed.  Eat small, frequent meals.  Wear loose-fitting clothes.  Avoid lying down for 3 hours after a meal.  Raise the head of your bed 6 to 8 inches by securing wood blocks under the bedposts. Just using extra pillows will not help, but using a wedge-shaped  pillow may be helpful.  Medications  H2 blockers, such as cimetidine (Tagamet HB), famotidine (Pepcid AC), nizatidine (Axid AR), and ranitidine (Zantac 75), decrease acid production. They are available in prescription strength and over-the-counter strength. These drugs provide short-term relief and are effective for about half of those who have GERD symptoms.  Proton pump inhibitors include omeprazole (Prilosec, Zegerid), lansoprazole  (Prevacid), pantoprazole (Protonix), rabeprazole (Aciphex), and esomeprazole (Nexium), which are available by prescription. Prilosec is also available in over-the-counter strength. Proton pump inhibitors are more effective than H2 blockers and can relieve symptoms and heal the esophageal lining in almost everyone who has GERD.  Because drugs work in different ways, combinations of medications may help control symptoms. People who get heartburn after eating may take both antacids and H2 blockers. The antacids work first to neutralize the acid in the stomach, and then the H2 blockers act on acid production. By the time the antacid stops working, the H2 blocker will have stopped acid production. Your health care provider is the best source of information about how to use medications for GERD.   Points to Remember 1. You can have GERD without having heartburn. Your symptoms could include a dry cough, asthma symptoms, or trouble swallowing.  2. Taking medications daily as prescribed is important in controlling you symptoms.  Sometimes it can take up to 8 weeks to fully achieve the effects of the medications prescribed.  3. Coughing related to GERD can be difficult to treat and is very frustrating!  However, it is important to stick with these medications and lifestyle modifications before pursuing more aggressive or invasive test and treatments.

## 2021-07-18 NOTE — Progress Notes (Signed)
[name]    [id number]    [birth year]  Primary Care Physician:Daniel, Thayer Headings, NP Date of Appointment: 07/18/2021 Established Patient Visit  Chief complaint:   Chief Complaint  Patient presents with   Consult    1 mo f/u for asthma. States she had covid about 3 weeks ago while on cruise.      HPI: [name] is a 60 y.o. woman with asthma.   Interval Updates: Went to [place] on a [place] - got covid and had to isolate for 5 days in cabin. Symptoms improved now. Was not given any anti-virals. These are not available in [place].    Current Regimen: advair hfa 115 2 puffs BID. singulair (doesn't like the diskus, makes her cough more) Asthma Triggers:  smoke, vinegar, spicy foods, molds, seasonal allergies.  Exacerbations in the last year: 3 requiring prednisone History of hospitalization or intubation: never Allergy Testing: yes immuno GERD: yes on protonix 20 mg once daily Allergic Rhinitis: yes ACT:  Asthma Control Test ACT Total Score  07/18/2021 14  06/13/2021 9   FeNO: 14 ppb   I have reviewed the patient's family social and past medical history and updated as appropriate.   Past Medical History:  Diagnosis Date   Arthritis    Asthma    Endometriosis    GERD (gastroesophageal reflux disease)    History of kidney stones    History of migraine headaches    Hyperlipidemia    Hypothyroidism    Insomnia    Pneumonia    PONV (postoperative nausea and vomiting)    Skin cancer    Face    Past Surgical History:  Procedure Laterality Date   ABDOMINAL HYSTERECTOMY     AUGMENTATION MAMMAPLASTY Bilateral    LAPAROSCOPY     SKIN CANCER EXCISION     TOTAL KNEE ARTHROPLASTY Right 12/21/2020   Procedure: RIGHT TOTAL KNEE ARTHROPLASTY;  Surgeon: Mcarthur Rossetti, MD;  Location: [facility];  Service: Orthopedics;  Laterality: Right;   URETHRAL DILATION      Family History  Problem Relation Age of Onset   Lung disease Neg Hx      Social History   Occupational History   Not on file  Tobacco Use   Smoking status: Never   Smokeless tobacco: Never  Vaping Use   Vaping Use: Never used  Substance and Sexual Activity   Alcohol use: Yes    Comment: Rare wine   Drug use: Never   Sexual activity: Not on file     Physical Exam: Blood pressure 122/74, pulse 81, height 5\' 6"  (1.676 m), weight 206 lb (93.4 kg), SpO2 97 %.  Gen:      No acute distress ENT:  no nasal polyps, mucus membranes moist Lungs:    No increased respiratory effort, symmetric chest wall excursion, clear to auscultation bilaterally, no wheezes or crackles CV:         Regular rate and rhythm; no murmurs, rubs, or gallops.  No pedal edema   Data Reviewed: Imaging:   PFTs: No flowsheet data found. I have personally reviewed the patient's PFTs and spirometry is normal 06/13/21  Labs: Region 2 allergy panel - negative Ige low Eosinophils low  Immunization status: Immunization History  Administered Date(s) Administered   Influenza Split 05/28/2012, 04/09/2016   Influenza, Seasonal, Injecte, Preservative Fre 06/18/2015   Influenza,inj,quad, With Preservative 05/04/2018   Influenza-Unspecified 05/06/2021   PFIZER(Purple Top)SARS-COV-2 Vaccination 10/06/2019,  11/02/2019, 05/07/2020, 12/05/2020   PPD Test 08/04/2008   Pneumococcal-Unspecified 02/22/2010   Tdap 05/28/2012   Zoster Recombinat (Shingrix) 03/18/2021     Assessment:  Moderate persistent asthma, improved control GERD, improved control Allergic rhinitis, improved control  Plan/Recommendations: Continue advair at current dose. Continue prn albuterol Ok to step down PPI to 20 mg BID Continue singulair She is motivated to step down therapy when stable.   Return to Care: Return in about 3 months (around 10/16/2021).   Lenice Llamas, MD Pulmonary and [place]

## 2021-07-21 IMAGING — DX DG KNEE 1-2V PORT*R*
2 series · 2 of 2 positions shown · non-contrast
Comparison: None.

CLINICAL DATA: Postop knee replacement

EXAM:
PORTABLE RIGHT KNEE - 1-2 VIEW

[knee ap]
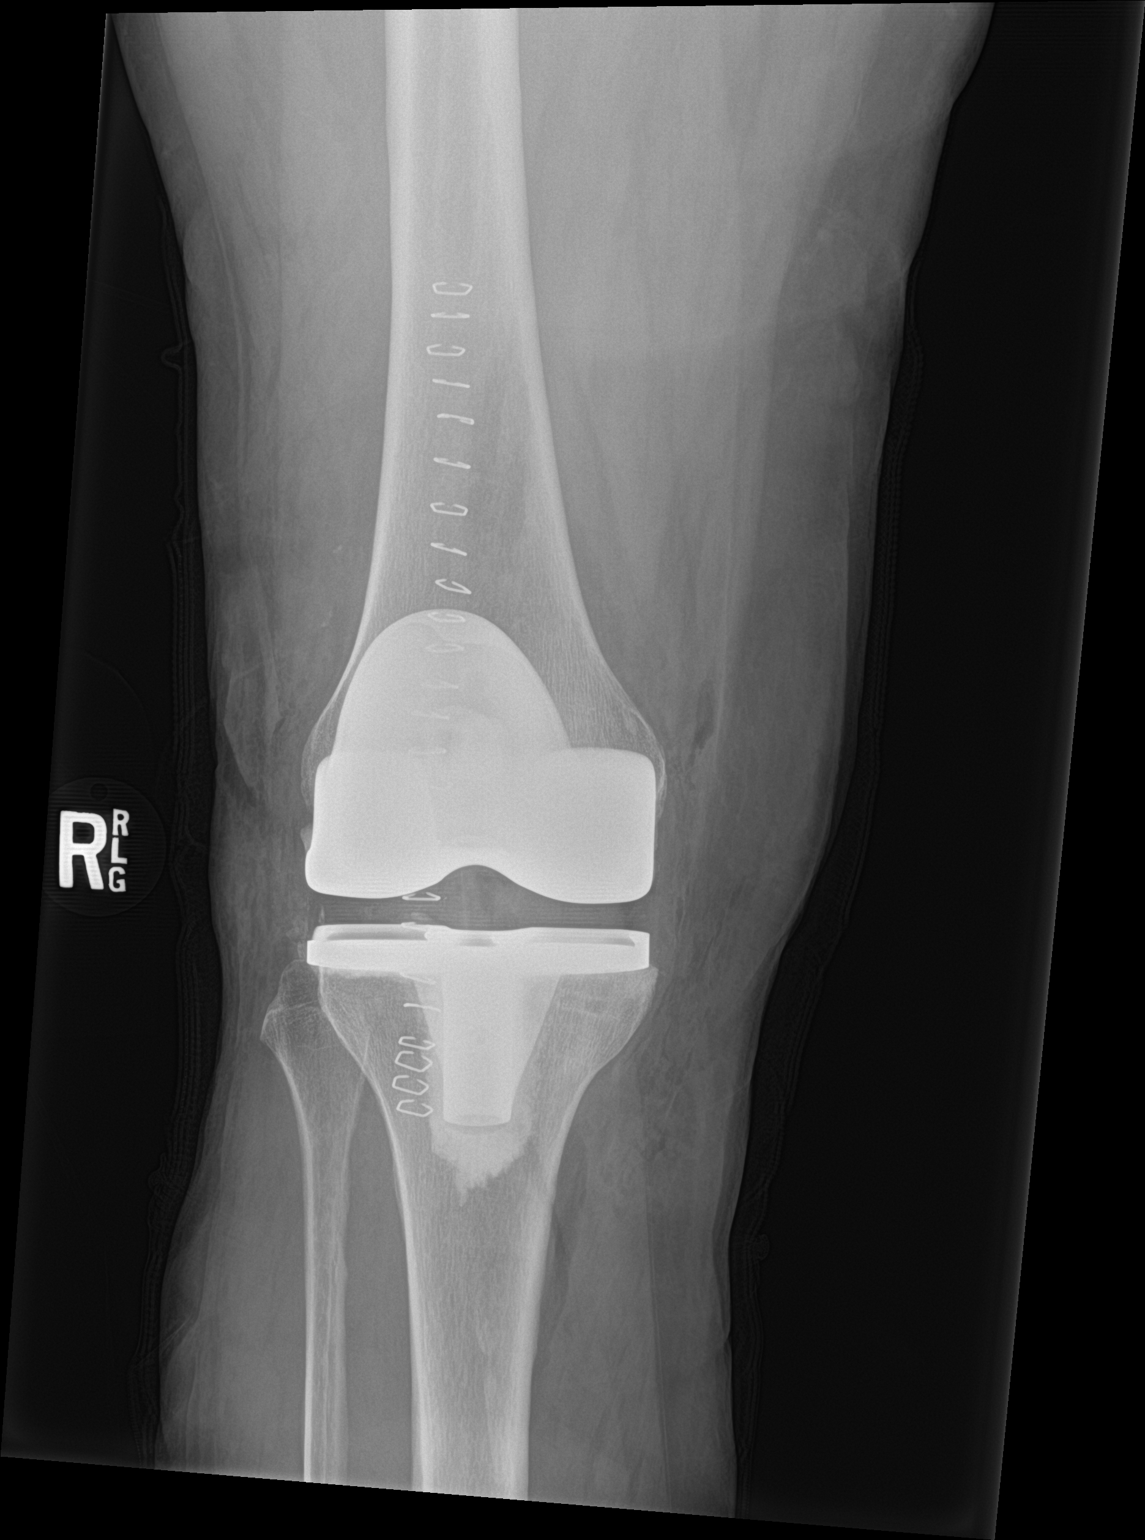

[knee lat]
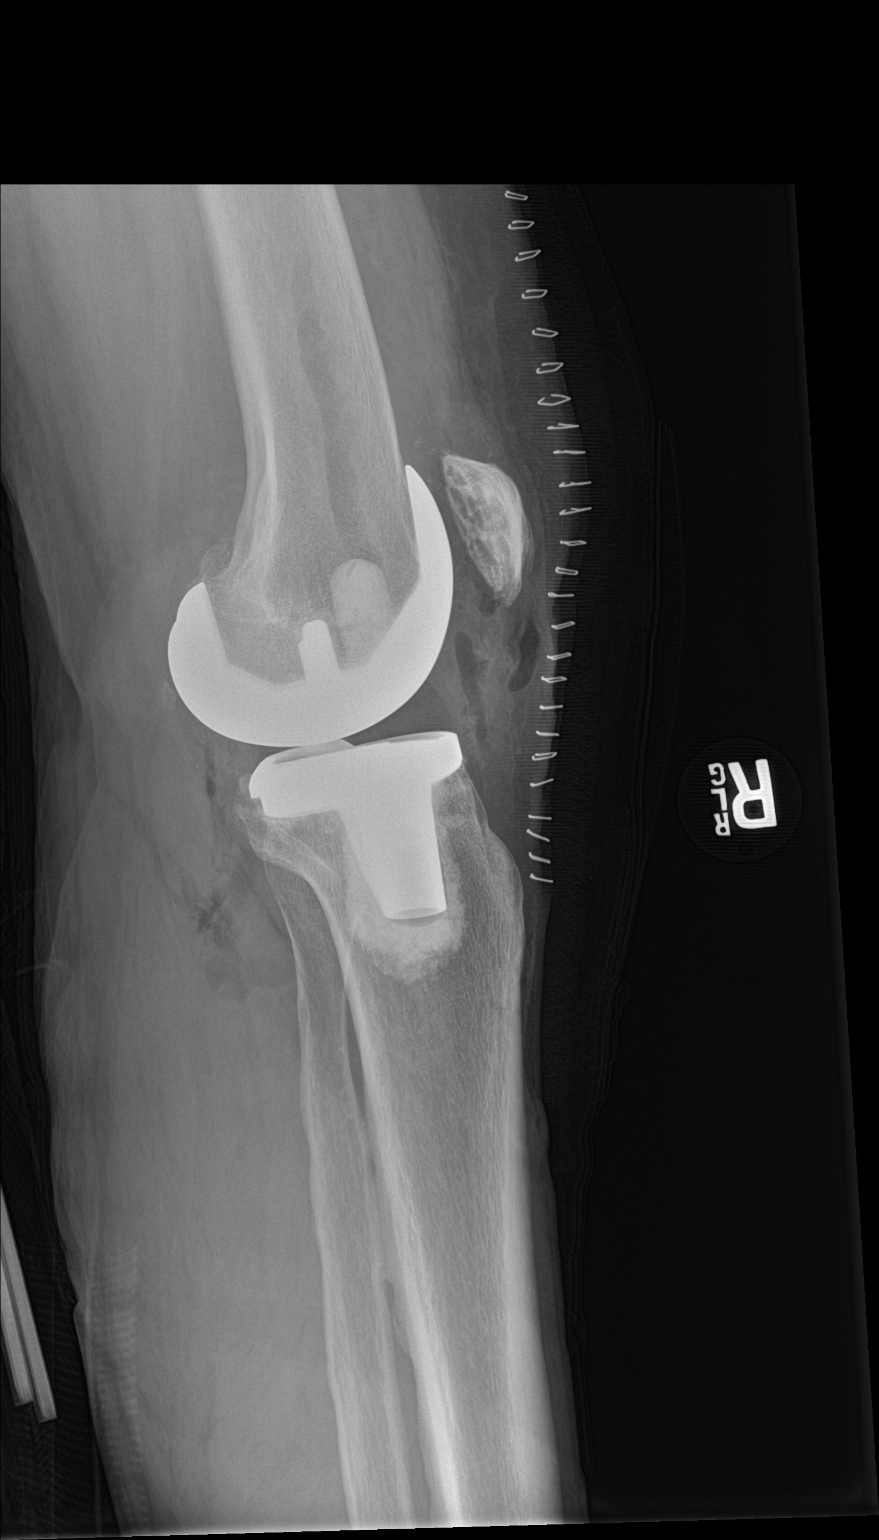

[2 of 2 positions shown; findings below may reference images not displayed]

FINDINGS: Changes of right knee replacement. Soft tissue and joint space gas
noted. No hardware bony complicating feature.
IMPRESSION: Right knee replacement.  No visible complicating feature.

## 2021-08-01 ENCOUNTER — Ambulatory Visit (INDEPENDENT_AMBULATORY_CARE_PROVIDER_SITE_OTHER): Payer: BC Managed Care – PPO | Admitting: Orthopaedic Surgery

## 2021-08-01 ENCOUNTER — Encounter: Payer: Self-pay | Admitting: Orthopaedic Surgery

## 2021-08-01 ENCOUNTER — Ambulatory Visit: Payer: Self-pay

## 2021-08-01 ENCOUNTER — Other Ambulatory Visit: Payer: Self-pay

## 2021-08-01 DIAGNOSIS — Z96651 Presence of right artificial knee joint: Secondary | ICD-10-CM

## 2021-08-01 NOTE — Progress Notes (Signed)
The patient is now 7 months status post a right total knee arthroplasty.  She reports that she has good range of motion and strength and she is doing well and it has been life-changing for her.  She does have known osteoarthritis of her left knee but is not bothering her enough yet to do anything about it.  She has been to [place] and walked quite a bit as well.  Examination of her right operative knee shows she has full range of motion.  It is ligamentously stable.  2 views of the right knee show well-seated total knee arthroplasty with no complicating features.  At this point follow-up for the right knee can be as needed.  If she is develops issues with her left knee she knows to let us know.  All questions and concerns were answered and addressed.

## 2021-09-02 ENCOUNTER — Ambulatory Visit: Payer: BC Managed Care – PPO | Admitting: Orthopaedic Surgery

## 2021-10-07 ENCOUNTER — Other Ambulatory Visit (HOSPITAL_COMMUNITY): Payer: Self-pay

## 2021-10-09 ENCOUNTER — Telehealth: Payer: Self-pay

## 2021-10-09 ENCOUNTER — Other Ambulatory Visit (HOSPITAL_COMMUNITY): Payer: Self-pay

## 2021-10-09 NOTE — Telephone Encounter (Signed)
Patient Advocate Encounter ? ?Prior Authorization for Pantoprazole Sodium '40mg'$  tabs has been approved.   ? ?PA# N/A ? ?Effective dates: 10/08/21 through 10/07/22 ? ?Per Test Claim Patients co-pay is $$7.08.  ? ?Spoke with Pharmacy to Process. ? ?Patient Advocate ?Fax: [phone]  ?

## 2021-11-27 IMAGING — MG DIGITAL SCREENING BREAST BILAT IMPLANT W/ TOMO W/ CAD
8 of 12 series · 8 of 28 positions shown · non-contrast
Comparison: Previous exam(s).

CLINICAL DATA: Screening.

EXAM:
DIGITAL SCREENING BILATERAL MAMMOGRAM WITH IMPLANTS, CAD AND
TOMOSYNTHESIS
TECHNIQUE: Bilateral screening digital craniocaudal and mediolateral oblique
mammograms were obtained. Bilateral screening digital breast
tomosynthesis was performed. The images were evaluated with
computer-aided detection. Standard and/or implant displaced views
were performed.

[L MLO]
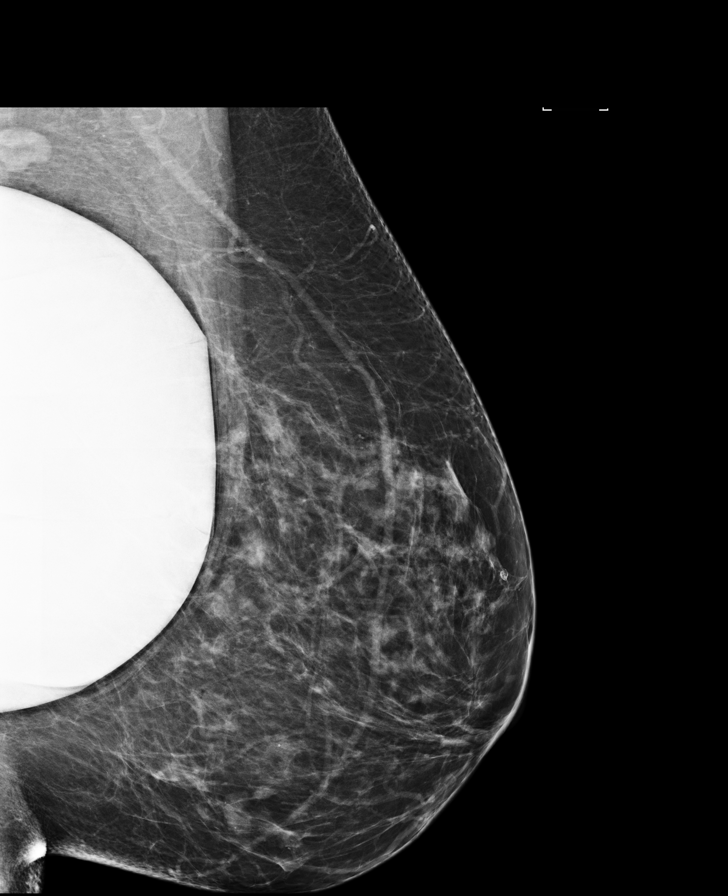

[R CC]
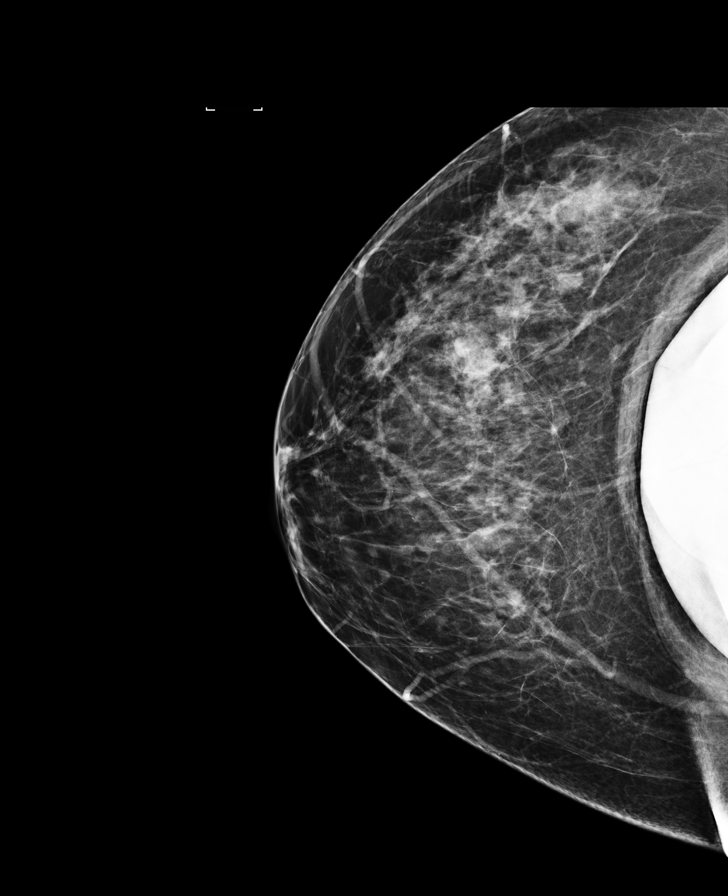

[L CC]
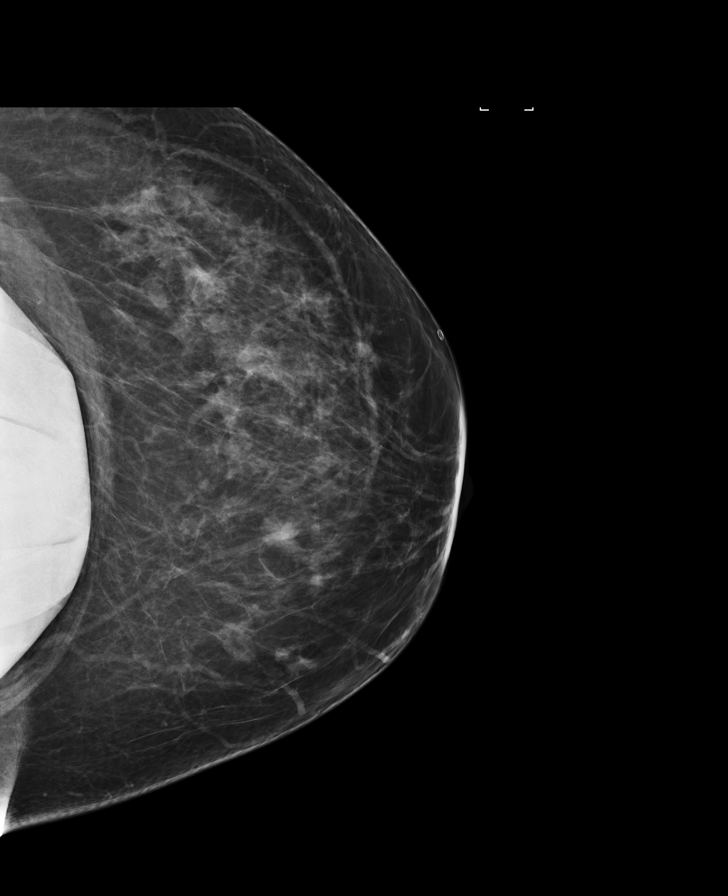

[R MLO]
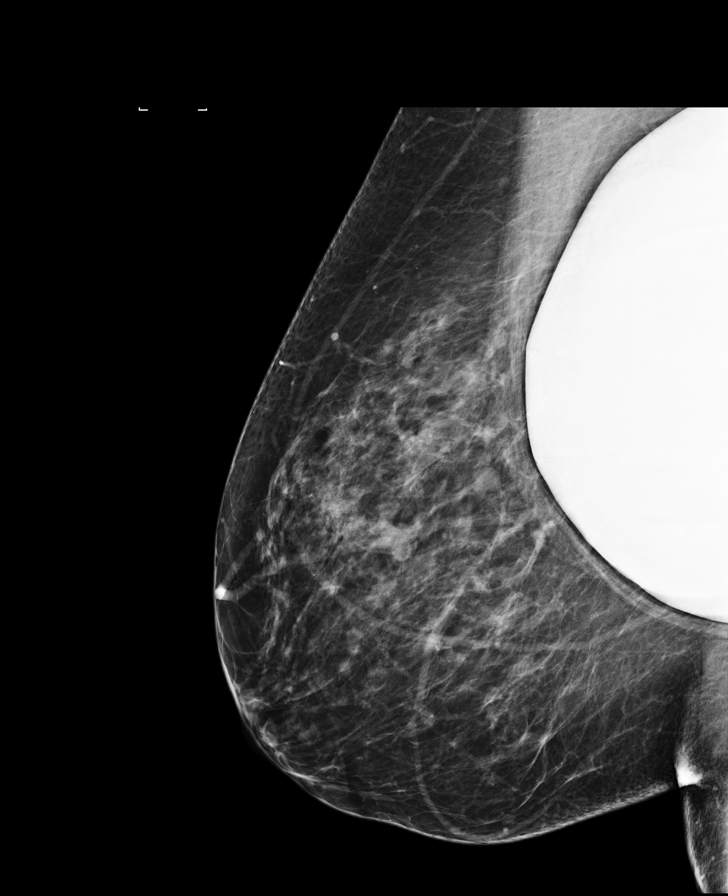

[R CC synth-2D]
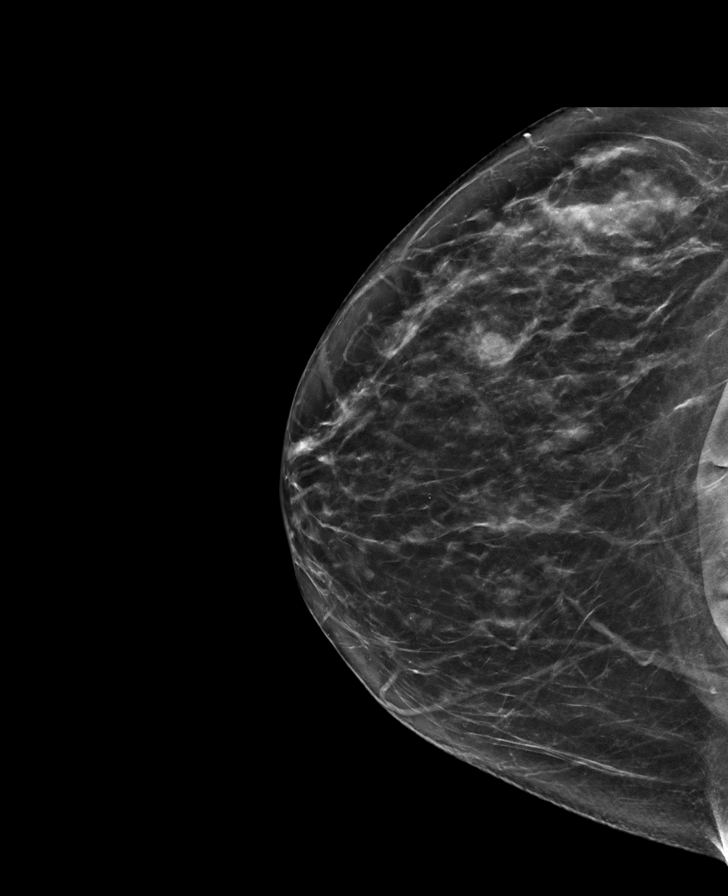

[L MLO synth-2D]
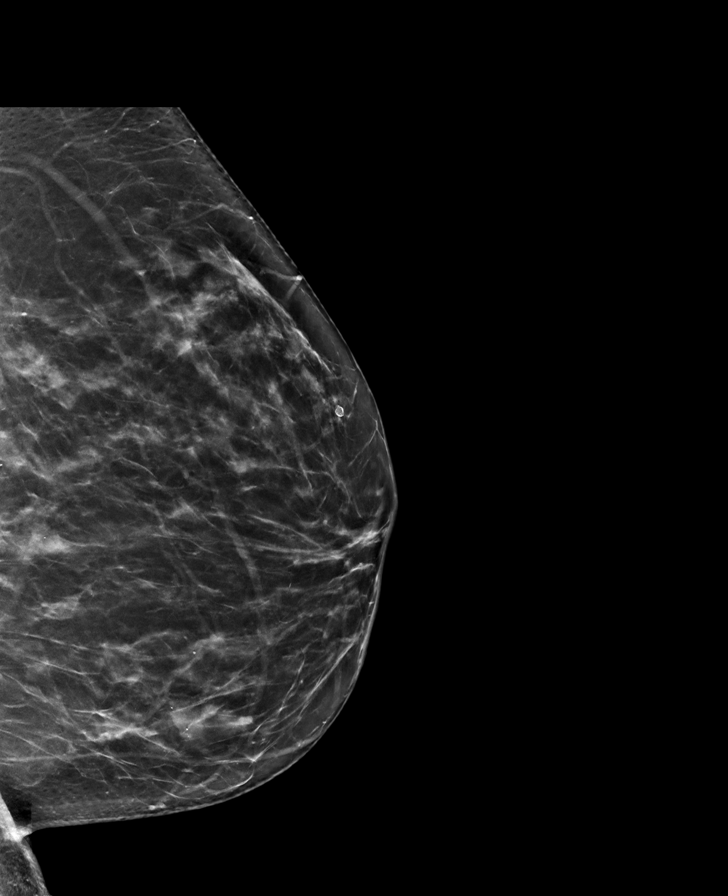

[R MLO synth-2D]
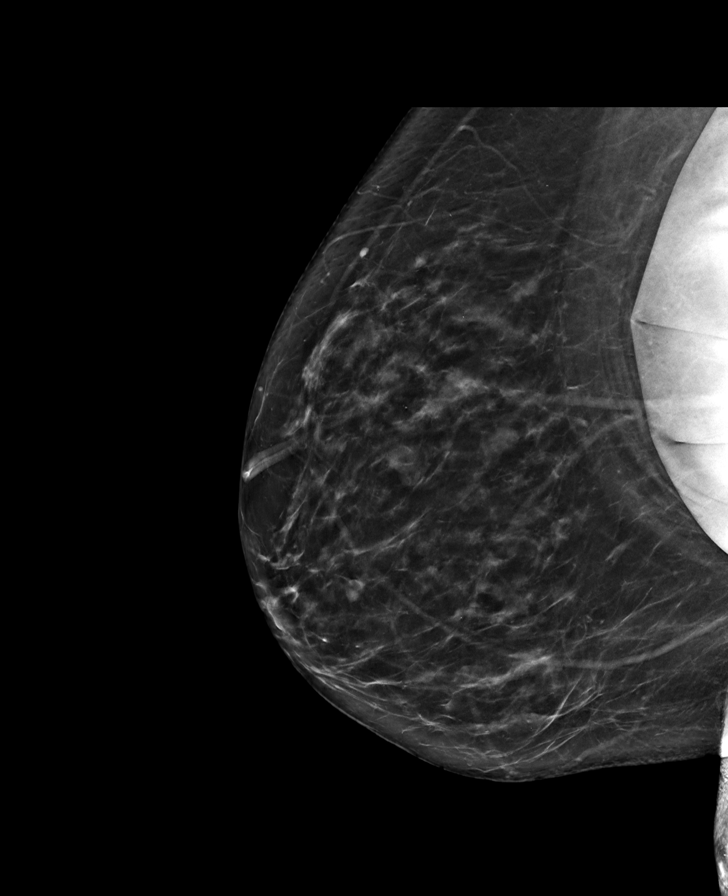

[L CC synth-2D]
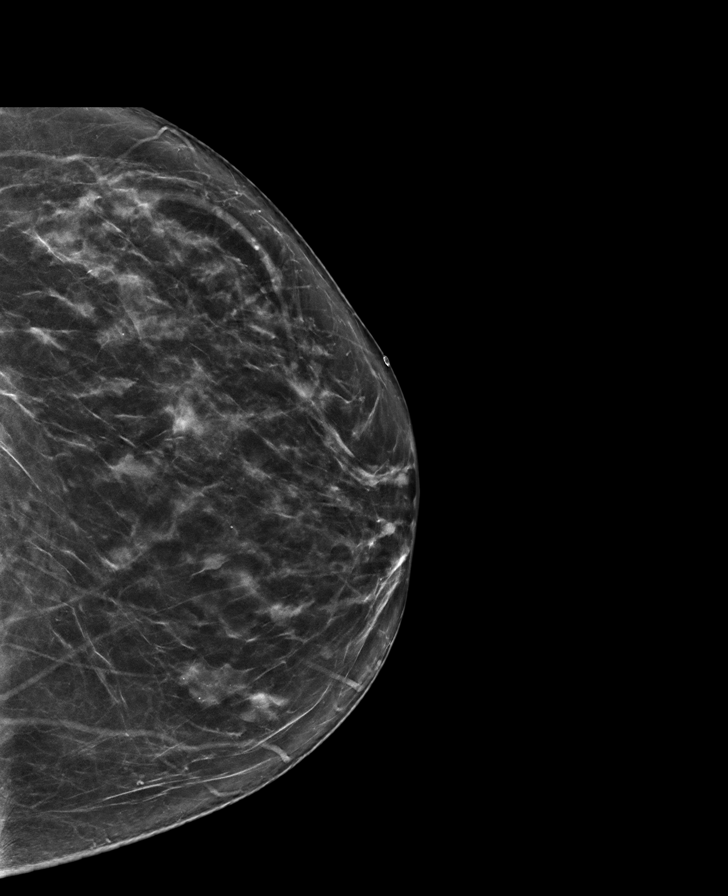

[8 of 28 positions shown; findings below may reference images not displayed]

ACR Breast Density Category c: The breast tissue is heterogeneously
dense, which may obscure small masses.
FINDINGS: The patient has retropectoral implants. There are no findings
suspicious for malignancy.
IMPRESSION: No mammographic evidence of malignancy. A result letter of this
screening mammogram will be mailed directly to the patient.

RECOMMENDATION:
Screening mammogram in one year. (Code:LT-E-7TH)

BI-RADS CATEGORY  1:  Negative.

## 2022-01-08 ENCOUNTER — Other Ambulatory Visit: Payer: Self-pay | Admitting: Internal Medicine

## 2022-01-08 DIAGNOSIS — K219 Gastro-esophageal reflux disease without esophagitis: Secondary | ICD-10-CM

## 2022-01-08 DIAGNOSIS — J4540 Moderate persistent asthma, uncomplicated: Secondary | ICD-10-CM

## 2022-01-10 DIAGNOSIS — Z0000 Encounter for general adult medical examination without abnormal findings: Secondary | ICD-10-CM | POA: Diagnosis not present

## 2022-01-10 DIAGNOSIS — E7800 Pure hypercholesterolemia, unspecified: Secondary | ICD-10-CM | POA: Diagnosis not present

## 2022-01-10 DIAGNOSIS — I10 Essential (primary) hypertension: Secondary | ICD-10-CM | POA: Diagnosis not present

## 2022-01-10 DIAGNOSIS — E669 Obesity, unspecified: Secondary | ICD-10-CM | POA: Diagnosis not present

## 2022-01-10 DIAGNOSIS — R7303 Prediabetes: Secondary | ICD-10-CM | POA: Diagnosis not present

## 2022-01-10 DIAGNOSIS — Z1231 Encounter for screening mammogram for malignant neoplasm of breast: Secondary | ICD-10-CM | POA: Diagnosis not present

## 2022-01-10 DIAGNOSIS — Z13220 Encounter for screening for lipoid disorders: Secondary | ICD-10-CM | POA: Diagnosis not present

## 2022-04-10 ENCOUNTER — Other Ambulatory Visit: Payer: Self-pay | Admitting: Internal Medicine

## 2022-04-10 DIAGNOSIS — K219 Gastro-esophageal reflux disease without esophagitis: Secondary | ICD-10-CM

## 2022-04-10 DIAGNOSIS — J4540 Moderate persistent asthma, uncomplicated: Secondary | ICD-10-CM

## 2022-04-17 DIAGNOSIS — N281 Cyst of kidney, acquired: Secondary | ICD-10-CM | POA: Diagnosis not present

## 2022-06-12 DIAGNOSIS — Z85828 Personal history of other malignant neoplasm of skin: Secondary | ICD-10-CM | POA: Diagnosis not present

## 2022-06-12 DIAGNOSIS — L304 Erythema intertrigo: Secondary | ICD-10-CM | POA: Diagnosis not present

## 2022-06-12 DIAGNOSIS — D1801 Hemangioma of skin and subcutaneous tissue: Secondary | ICD-10-CM | POA: Diagnosis not present

## 2022-06-12 DIAGNOSIS — L821 Other seborrheic keratosis: Secondary | ICD-10-CM | POA: Diagnosis not present

## 2022-08-06 DIAGNOSIS — R7303 Prediabetes: Secondary | ICD-10-CM | POA: Diagnosis not present

## 2022-08-06 DIAGNOSIS — I10 Essential (primary) hypertension: Secondary | ICD-10-CM | POA: Diagnosis not present

## 2022-08-06 DIAGNOSIS — E7800 Pure hypercholesterolemia, unspecified: Secondary | ICD-10-CM | POA: Diagnosis not present

## 2022-08-06 DIAGNOSIS — G729 Myopathy, unspecified: Secondary | ICD-10-CM | POA: Diagnosis not present

## 2022-09-09 DIAGNOSIS — K120 Recurrent oral aphthae: Secondary | ICD-10-CM | POA: Diagnosis not present

## 2022-11-13 DIAGNOSIS — R1084 Generalized abdominal pain: Secondary | ICD-10-CM | POA: Diagnosis not present

## 2022-11-13 DIAGNOSIS — R311 Benign essential microscopic hematuria: Secondary | ICD-10-CM | POA: Diagnosis not present

## 2023-01-07 DIAGNOSIS — S6010XA Contusion of unspecified finger with damage to nail, initial encounter: Secondary | ICD-10-CM | POA: Diagnosis not present

## 2023-02-03 DIAGNOSIS — E7800 Pure hypercholesterolemia, unspecified: Secondary | ICD-10-CM | POA: Diagnosis not present

## 2023-02-03 DIAGNOSIS — R7303 Prediabetes: Secondary | ICD-10-CM | POA: Diagnosis not present

## 2023-02-03 DIAGNOSIS — Z23 Encounter for immunization: Secondary | ICD-10-CM | POA: Diagnosis not present

## 2023-02-03 DIAGNOSIS — Z0000 Encounter for general adult medical examination without abnormal findings: Secondary | ICD-10-CM | POA: Diagnosis not present

## 2023-02-03 DIAGNOSIS — G729 Myopathy, unspecified: Secondary | ICD-10-CM | POA: Diagnosis not present

## 2023-02-03 DIAGNOSIS — I10 Essential (primary) hypertension: Secondary | ICD-10-CM | POA: Diagnosis not present

## 2023-02-26 DIAGNOSIS — J45909 Unspecified asthma, uncomplicated: Secondary | ICD-10-CM | POA: Diagnosis not present

## 2023-02-26 DIAGNOSIS — R051 Acute cough: Secondary | ICD-10-CM | POA: Diagnosis not present

## 2023-02-26 DIAGNOSIS — J209 Acute bronchitis, unspecified: Secondary | ICD-10-CM | POA: Diagnosis not present

## 2023-06-13 DIAGNOSIS — J020 Streptococcal pharyngitis: Secondary | ICD-10-CM | POA: Diagnosis not present

## 2023-06-13 DIAGNOSIS — R051 Acute cough: Secondary | ICD-10-CM | POA: Diagnosis not present

## 2023-06-13 DIAGNOSIS — Z03818 Encounter for observation for suspected exposure to other biological agents ruled out: Secondary | ICD-10-CM | POA: Diagnosis not present

## 2023-06-13 DIAGNOSIS — J45909 Unspecified asthma, uncomplicated: Secondary | ICD-10-CM | POA: Diagnosis not present

## 2023-08-25 ENCOUNTER — Other Ambulatory Visit: Payer: Self-pay | Admitting: Nurse Practitioner

## 2023-08-25 ENCOUNTER — Ambulatory Visit
Admission: RE | Admit: 2023-08-25 | Discharge: 2023-08-25 | Disposition: A | Discharge status: Home / Self Care | Payer: PPO | Source: Ambulatory Visit | Attending: Family Medicine | Admitting: Family Medicine

## 2023-08-25 DIAGNOSIS — Z1231 Encounter for screening mammogram for malignant neoplasm of breast: Secondary | ICD-10-CM

## 2023-08-27 ENCOUNTER — Other Ambulatory Visit: Payer: Self-pay | Admitting: Nurse Practitioner

## 2023-08-27 DIAGNOSIS — R928 Other abnormal and inconclusive findings on diagnostic imaging of breast: Secondary | ICD-10-CM

## 2023-09-23 ENCOUNTER — Other Ambulatory Visit: Payer: Self-pay | Admitting: Family Medicine

## 2023-09-23 DIAGNOSIS — Z780 Asymptomatic menopausal state: Secondary | ICD-10-CM

## 2023-09-27 ENCOUNTER — Other Ambulatory Visit: Payer: Self-pay | Admitting: Family Medicine

## 2023-09-27 DIAGNOSIS — Z780 Asymptomatic menopausal state: Secondary | ICD-10-CM

## 2023-09-29 ENCOUNTER — Other Ambulatory Visit: Payer: Self-pay | Admitting: Family Medicine

## 2023-09-29 DIAGNOSIS — R928 Other abnormal and inconclusive findings on diagnostic imaging of breast: Secondary | ICD-10-CM

## 2023-09-30 ENCOUNTER — Ambulatory Visit
Admission: RE | Admit: 2023-09-30 | Discharge: 2023-09-30 | Disposition: A | Discharge status: Home / Self Care | Payer: PPO | Source: Ambulatory Visit | Attending: Nurse Practitioner | Admitting: Nurse Practitioner

## 2023-09-30 ENCOUNTER — Ambulatory Visit
Admission: RE | Admit: 2023-09-30 | Discharge: 2023-09-30 | Discharge status: Home / Self Care | Payer: PPO | Source: Ambulatory Visit | Attending: Nurse Practitioner | Admitting: Nurse Practitioner

## 2023-09-30 ENCOUNTER — Other Ambulatory Visit: Payer: Self-pay | Admitting: Family Medicine

## 2023-09-30 DIAGNOSIS — R928 Other abnormal and inconclusive findings on diagnostic imaging of breast: Secondary | ICD-10-CM

## 2023-10-13 ENCOUNTER — Other Ambulatory Visit: Payer: Self-pay | Admitting: Family Medicine

## 2023-10-13 DIAGNOSIS — R928 Other abnormal and inconclusive findings on diagnostic imaging of breast: Secondary | ICD-10-CM

## 2023-10-13 DIAGNOSIS — N6489 Other specified disorders of breast: Secondary | ICD-10-CM | POA: Diagnosis not present

## 2023-10-13 DIAGNOSIS — Z9882 Breast implant status: Secondary | ICD-10-CM | POA: Diagnosis not present

## 2023-10-21 ENCOUNTER — Ambulatory Visit

## 2023-10-21 DIAGNOSIS — Z780 Asymptomatic menopausal state: Secondary | ICD-10-CM

## 2023-11-16 ENCOUNTER — Ambulatory Visit
Admission: RE | Admit: 2023-11-16 | Discharge: 2023-11-16 | Disposition: A | Discharge status: Home / Self Care | Source: Ambulatory Visit | Attending: Family Medicine | Admitting: Family Medicine

## 2023-11-16 DIAGNOSIS — Z9882 Breast implant status: Secondary | ICD-10-CM | POA: Diagnosis not present

## 2023-11-16 DIAGNOSIS — R928 Other abnormal and inconclusive findings on diagnostic imaging of breast: Secondary | ICD-10-CM

## 2023-11-16 MED ORDER — GADOPICLENOL 0.5 MMOL/ML IV SOLN
9.0000 mL | Freq: Once | INTRAVENOUS | Status: AC | PRN
  Administered 2023-11-16: 9 mL via INTRAVENOUS

## 2023-11-18 ENCOUNTER — Other Ambulatory Visit: Payer: Self-pay | Admitting: Family Medicine

## 2023-11-18 DIAGNOSIS — N6489 Other specified disorders of breast: Secondary | ICD-10-CM

## 2023-12-14 ENCOUNTER — Encounter (HOSPITAL_COMMUNITY): Payer: Self-pay

## 2023-12-15 DIAGNOSIS — J209 Acute bronchitis, unspecified: Secondary | ICD-10-CM | POA: Diagnosis not present

## 2023-12-15 DIAGNOSIS — R0981 Nasal congestion: Secondary | ICD-10-CM | POA: Diagnosis not present

## 2023-12-15 DIAGNOSIS — R059 Cough, unspecified: Secondary | ICD-10-CM | POA: Diagnosis not present

## 2023-12-21 ENCOUNTER — Ambulatory Visit
Admission: RE | Admit: 2023-12-21 | Discharge: 2023-12-21 | Disposition: A | Discharge status: Home / Self Care | Source: Ambulatory Visit | Attending: Family Medicine | Admitting: Family Medicine

## 2023-12-21 ENCOUNTER — Other Ambulatory Visit: Payer: Self-pay | Admitting: Family Medicine

## 2023-12-21 DIAGNOSIS — N6489 Other specified disorders of breast: Secondary | ICD-10-CM

## 2023-12-21 DIAGNOSIS — R92323 Mammographic fibroglandular density, bilateral breasts: Secondary | ICD-10-CM | POA: Diagnosis not present

## 2023-12-21 DIAGNOSIS — N6012 Diffuse cystic mastopathy of left breast: Secondary | ICD-10-CM | POA: Diagnosis not present

## 2023-12-21 DIAGNOSIS — R928 Other abnormal and inconclusive findings on diagnostic imaging of breast: Secondary | ICD-10-CM | POA: Diagnosis not present

## 2023-12-21 DIAGNOSIS — N6011 Diffuse cystic mastopathy of right breast: Secondary | ICD-10-CM | POA: Diagnosis not present

## 2023-12-22 LAB — SURGICAL PATHOLOGY

## 2023-12-29 DIAGNOSIS — N6489 Other specified disorders of breast: Secondary | ICD-10-CM | POA: Diagnosis not present

## 2024-01-05 ENCOUNTER — Encounter: Payer: Self-pay | Admitting: Plastic Surgery

## 2024-01-05 ENCOUNTER — Ambulatory Visit: Admitting: Plastic Surgery

## 2024-01-05 VITALS — BP 134/79 | HR 78 | Ht 66.5 in | Wt 208.8 lb

## 2024-01-05 DIAGNOSIS — M546 Pain in thoracic spine: Secondary | ICD-10-CM

## 2024-01-05 DIAGNOSIS — N62 Hypertrophy of breast: Secondary | ICD-10-CM | POA: Diagnosis not present

## 2024-01-05 DIAGNOSIS — G8929 Other chronic pain: Secondary | ICD-10-CM

## 2024-01-05 DIAGNOSIS — M549 Dorsalgia, unspecified: Secondary | ICD-10-CM | POA: Insufficient documentation

## 2024-01-05 NOTE — Progress Notes (Signed)
 Patient ID: [name], female    DOB: [birth year], 63 y.o.   MRN: [id number]   Chief Complaint  Patient presents with   Advice Only   Breast Problem    The patient is a 63 year old female here for evaluation of her breast.  Many years ago she had implants put in.  They are silicone and under the muscle as far as she knows.  She also had a mastopexy at the same time.  She is 5 feet 6 inches tall and weighs 208 pounds.  She now notices a lot of neck and back pain.  She has had cervical spine issues and knee issues as well.  Her past medical history is positive for thyroid  disease, hyperlipidemia, endometriosis, asthma, arthritis, skin cancer and history of migraines.  Her past surgical history includes breast biopsy bilaterally, total knee arthroplasty on the right, hysterectomy, and skin cancer excision.  With the back issues the patient would like to go ahead and have the implants removed.  She is also going to have breast biopsies done by general surgery so she was hoping to do it at the same time.     Review of Systems  Constitutional:  Positive for activity change. Negative for appetite change.  HENT: Negative.    Eyes: Negative.   Respiratory: Negative.    Cardiovascular: Negative.   Gastrointestinal: Negative.   Endocrine: Negative.   Genitourinary: Negative.   Musculoskeletal:  Positive for back pain and neck pain.  Skin:  Positive for rash.    Past Medical History:  Diagnosis Date   Arthritis    Asthma    Endometriosis    GERD (gastroesophageal reflux disease)    History of kidney stones    History of migraine headaches    Hyperlipidemia    Hypothyroidism    Insomnia    Pneumonia    PONV (postoperative nausea and vomiting)    Skin cancer    Face    Past Surgical History:  Procedure Laterality Date   ABDOMINAL HYSTERECTOMY     AUGMENTATION MAMMAPLASTY Bilateral    BREAST BIOPSY Left 12/21/2023   MM LT BREAST BX W LOC DEV 1ST LESION IMAGE BX SPEC STEREO GUIDE  12/21/2023 [facility]   BREAST BIOPSY Right 12/21/2023   MM RT BREAST BX W LOC DEV 1ST LESION IMAGE BX SPEC STEREO GUIDE 12/21/2023 [facility]   BREAST BIOPSY Left 12/21/2023   MM LT BREAST BX W LOC DEV EA AD LESION IMG BX SPEC STEREO GUIDE 12/21/2023 [facility]   LAPAROSCOPY     SKIN CANCER EXCISION     TOTAL KNEE ARTHROPLASTY Right 12/21/2020   Procedure: RIGHT TOTAL KNEE ARTHROPLASTY;  Surgeon: Arnie Lao, MD;  Location: [facility];  Service: Orthopedics;  Laterality: Right;   URETHRAL DILATION        Current Outpatient Medications:    ADVAIR HFA 115-21 MCG/ACT inhaler, Inhale 2 puffs into the lungs 2 (two) times daily., Disp: , Rfl:    albuterol  (VENTOLIN  HFA) 108 (90 Base) MCG/ACT inhaler, Inhale 2 puffs into the lungs every 6 (six) hours as needed for wheezing or shortness of breath., Disp: , Rfl:    b complex vitamins capsule, Take 1 capsule by mouth daily., Disp: , Rfl:    brompheniramine-pseudoephedrine-DM 30-2-10 MG/5ML syrup, Take 10 mLs by mouth 4 (four) times daily as needed., Disp: , Rfl:    cholecalciferol  (VITAMIN D3) 25 MCG (1000 UNIT) tablet, Take 1,000 Units by mouth daily., Disp: ,  Rfl:    Dextran 70-Hypromellose (ARTIFICIAL TEARS) 0.1-0.3 % SOLN, Place 1 drop into both eyes daily as needed (dry eyes)., Disp: , Rfl:    fenofibrate  160 MG tablet, Take 160 mg by mouth every evening., Disp: , Rfl:    gabapentin (NEURONTIN) 300 MG capsule, Take 300 mg by mouth 3 (three) times daily., Disp: , Rfl:    montelukast  (SINGULAIR ) 10 MG tablet, Take 10 mg by mouth at bedtime., Disp: , Rfl:    Omega-3 Fatty Acids (FISH OIL) 1000 MG CAPS, Take 1,000 mg by mouth daily., Disp: , Rfl:    pantoprazole  (PROTONIX ) 40 MG tablet, TAKE 1 TABLET (40 MG TOTAL) BY MOUTH 2 (TWO) TIMES DAILY BEFORE A MEAL., Disp: 180 tablet, Rfl: 1   sodium chloride  (OCEAN) 0.65 % SOLN nasal spray, Place 1 spray into both nostrils as needed for congestion., Disp: , Rfl:     Spacer/Aero-Holding Chambers DEVI, Prescribe to be used with asthma, Disp: 1 each, Rfl: 0   Objective:   There were no vitals filed for this visit.  Physical Exam Vitals reviewed.  Constitutional:      Appearance: Normal appearance.  HENT:     Head: Atraumatic.  Cardiovascular:     Rate and Rhythm: Normal rate.     Pulses: Normal pulses.  Pulmonary:     Effort: Pulmonary effort is normal.  Abdominal:     Palpations: Abdomen is soft.  Skin:    General: Skin is warm.     Capillary Refill: Capillary refill takes less than 2 seconds.  Neurological:     Mental Status: She is alert and oriented to person, place, and time.  Psychiatric:        Mood and Affect: Mood normal.        Behavior: Behavior normal.        Thought Content: Thought content normal.        Judgment: Judgment normal.     Assessment & Plan:  Chronic bilateral thoracic back pain  Symptomatic mammary hypertrophy  The patient is a good candidate for removal of the implants.  I think this would make a big difference in removing the excess weight.  She does need a left mastopexy done which could be done at the same time or later depending on her wishes.  She is wanting to do this at the same time as general surgery does biopsies of her breast.  This seems very reasonable.  I would also recommend a capsulectomy.  With  Pictures were obtained of the patient and placed in the chart with the patient's or guardian's permission.   Lindaann Requena Dickey Caamano, DO

## 2024-01-19 ENCOUNTER — Ambulatory Visit: Payer: Self-pay | Admitting: General Surgery

## 2024-01-19 DIAGNOSIS — N6489 Other specified disorders of breast: Secondary | ICD-10-CM

## 2024-01-20 ENCOUNTER — Other Ambulatory Visit: Payer: Self-pay | Admitting: General Surgery

## 2024-01-20 DIAGNOSIS — N6489 Other specified disorders of breast: Secondary | ICD-10-CM

## 2024-01-21 DIAGNOSIS — R233 Spontaneous ecchymoses: Secondary | ICD-10-CM | POA: Diagnosis not present

## 2024-01-21 DIAGNOSIS — Z6833 Body mass index (BMI) 33.0-33.9, adult: Secondary | ICD-10-CM | POA: Diagnosis not present

## 2024-02-18 NOTE — Progress Notes (Signed)
 Surgical Instructions   Your procedure is scheduled on Thursday February 25, 2024. Report to [facility] Main Entrance A at 8:30 A.M., then check in with the Admitting office. Any questions or running late day of surgery: call [phone]  Questions prior to your surgery date: call [phone], Monday-Friday, 8am-4pm. If you experience any cold or flu symptoms such as cough, fever, chills, shortness of breath, etc. between now and your scheduled surgery, please notify us  at the above number.     Remember:  Do not eat after midnight the night before your surgery   You may drink clear liquids until 7:30 the morning of your surgery.   Clear liquids allowed are: Water, Non-Citrus Juices (without pulp), Carbonated Beverages, Clear Tea (no milk, honey, etc.), Black Coffee Only (NO MILK, CREAM OR POWDERED CREAMER of any kind), and Gatorade.    Take these medicines the morning of surgery with A SIP OF WATER  pantoprazole  (PROTONIX )   May take these medicines IF NEEDED: ADVAIR HFA 115-21 MCG/ACT inhaler  albuterol  (VENTOLIN  HFA) 108 (90 Base) MCG/ACT inhaler. Please bring with you to the hospital Dextran 70-Hypromellose (ARTIFICIAL TEARS) 0.1-0.3 % SOLN  sodium chloride  (OCEAN) 0.65 % SOLN nasal spray   One week prior to surgery, STOP taking any Aspirin  (unless otherwise instructed by your surgeon) Aleve, Naproxen, Ibuprofen, Motrin, Advil, Goody's, BC's, all herbal medications, fish oil, and non-prescription vitamins.                     Do NOT Smoke (Tobacco/Vaping) for 24 hours prior to your procedure.  If you use a CPAP at night, you may bring your mask/headgear for your overnight stay.   You will be asked to remove any contacts, glasses, piercing's, hearing aid's, dentures/partials prior to surgery. Please bring cases for these items if needed.    Patients discharged the day of surgery will not be allowed to drive home, and someone needs to stay with them for 24 hours.  SURGICAL  WAITING ROOM VISITATION Patients may have no more than 2 support people in the waiting area - these visitors may rotate.   Pre-op nurse will coordinate an appropriate time for 1 ADULT support person, who may not rotate, to accompany patient in pre-op.  Children under the age of 99 must have an adult with them who is not the patient and must remain in the main waiting area with an adult.  If the patient needs to stay at the hospital during part of their recovery, the visitor guidelines for inpatient rooms apply.  Please refer to the [facility] website for the visitor guidelines for any additional information.   If you received a COVID test during your pre-op visit  it is requested that you wear a mask when out in public, stay away from anyone that may not be feeling well and notify your surgeon if you develop symptoms. If you have been in contact with anyone that has tested positive in the last 10 days please notify you surgeon.      Pre-operative CHG Bathing Instructions   You can play a key role in reducing the risk of infection after surgery. Your skin needs to be as free of germs as possible. You can reduce the number of germs on your skin by washing with CHG (chlorhexidine  gluconate) soap before surgery. CHG is an antiseptic soap that kills germs and continues to kill germs even after washing.   DO NOT use if you have an allergy to chlorhexidine /CHG  or antibacterial soaps. If your skin becomes reddened or irritated, stop using the CHG and notify one of our RNs at [phone].              TAKE A SHOWER THE NIGHT BEFORE SURGERY AND THE DAY OF SURGERY    Please keep in mind the following:  DO NOT shave, including legs and underarms, 48 hours prior to surgery.   Place clean sheets on your bed the night before surgery Use a clean washcloth (not used since being washed) for each shower. DO NOT sleep with pet's night before surgery.  CHG Shower Instructions:  Wash your face and private  area with normal soap. If you choose to wash your hair, wash first with your normal shampoo.  After you use shampoo/soap, rinse your hair and body thoroughly to remove shampoo/soap residue.  Turn the water OFF and apply half the bottle of CHG soap to a CLEAN washcloth.  Apply CHG soap ONLY FROM YOUR NECK DOWN TO YOUR TOES (washing for 3-5 minutes)  DO NOT use CHG soap on face, private areas, open wounds, or sores.  Pay special attention to the area where your surgery is being performed.  If you are having back surgery, having someone wash your back for you may be helpful. Wait 2 minutes after CHG soap is applied, then you may rinse off the CHG soap.  Pat dry with a clean towel  Put on clean pajamas    Additional instructions for the day of surgery: DO NOT APPLY any lotions, deodorants or perfumes.   Do not wear jewelry or makeup Do not wear nail polish, gel polish, artificial nails, or any other type of covering on natural nails (fingers and toes) Do not bring valuables to the hospital. [facility] is not responsible for valuables/personal belongings. Put on clean/comfortable clothes.  Please brush your teeth.  Ask your nurse before applying any prescription medications to the skin.

## 2024-02-19 ENCOUNTER — Encounter (HOSPITAL_COMMUNITY)
Admission: RE | Admit: 2024-02-19 | Discharge: 2024-02-19 | Disposition: A | Discharge status: Home / Self Care | Source: Ambulatory Visit | Attending: General Surgery | Admitting: General Surgery

## 2024-02-19 ENCOUNTER — Other Ambulatory Visit: Payer: Self-pay

## 2024-02-19 ENCOUNTER — Encounter (HOSPITAL_COMMUNITY): Payer: Self-pay

## 2024-02-19 VITALS — BP 118/84 | HR 72 | Temp 98.4°F | Resp 17 | Ht 67.0 in | Wt 209.0 lb

## 2024-02-19 DIAGNOSIS — Z01812 Encounter for preprocedural laboratory examination: Secondary | ICD-10-CM | POA: Diagnosis not present

## 2024-02-19 DIAGNOSIS — Z01818 Encounter for other preprocedural examination: Secondary | ICD-10-CM

## 2024-02-19 LAB — CBC
HCT: 41.5 % (ref 36.0–46.0)
Hemoglobin: 13.6 g/dL (ref 12.0–15.0)
MCH: 30 pg (ref 26.0–34.0)
MCHC: 32.8 g/dL (ref 30.0–36.0)
MCV: 91.4 fL (ref 80.0–100.0)
Platelets: 402 K/uL — ABNORMAL HIGH (ref 150–400)
RBC: 4.54 MIL/uL (ref 3.87–5.11)
RDW: 13.3 % (ref 11.5–15.5)
WBC: 8.1 K/uL (ref 4.0–10.5)
nRBC: 0 % (ref 0.0–0.2)

## 2024-02-19 NOTE — Progress Notes (Signed)
 PCP - [facility] -   PPM/ICD - denies Device Orders - na Rep Notified - na  Chest x-ray - na EKG - na Stress Test -  ECHO -  Cardiac Cath -   Sleep Study - denies CPAP - na  Non-diabetic  Blood Thinner Instructions: denies Aspirin  Instructions:denies  ERAS Protcol - Clears until 0730  Anesthesia review: Yes. Seed placement  Patient denies shortness of breath, fever, cough and chest pain at PAT appointment   All instructions explained to the patient, with a verbal understanding of the material. Patient agrees to go over the instructions while at home for a better understanding. Patient also instructed to self quarantine after being tested for COVID-19. The opportunity to ask questions was provided.

## 2024-02-22 ENCOUNTER — Encounter: Payer: Self-pay | Admitting: Plastic Surgery

## 2024-02-22 NOTE — Telephone Encounter (Signed)
 [name], see msg about scheduling

## 2024-02-23 ENCOUNTER — Ambulatory Visit
Admission: RE | Admit: 2024-02-23 | Discharge: 2024-02-23 | Disposition: A | Discharge status: Home / Self Care | Source: Ambulatory Visit | Attending: General Surgery | Admitting: General Surgery

## 2024-02-23 DIAGNOSIS — N6489 Other specified disorders of breast: Secondary | ICD-10-CM

## 2024-02-23 DIAGNOSIS — R928 Other abnormal and inconclusive findings on diagnostic imaging of breast: Secondary | ICD-10-CM | POA: Diagnosis not present

## 2024-02-24 NOTE — Anesthesia Preprocedure Evaluation (Signed)
 Anesthesia Evaluation  Patient identified by MRN, date of birth, ID band Patient awake    Reviewed: Allergy & Precautions, NPO status , Patient's Chart, lab work & pertinent test results  History of Anesthesia Complications (+) PONV and history of anesthetic complications  Airway Mallampati: II  TM Distance: >3 FB Neck ROM: Full    Dental no notable dental hx. (+) Teeth Intact, Dental Advisory Given   Pulmonary asthma    Pulmonary exam normal breath sounds clear to auscultation       Cardiovascular (-) hypertension(-) angina (-) Past MI Normal cardiovascular exam Rhythm:Regular Rate:Normal     Neuro/Psych    GI/Hepatic ,GERD  ,,  Endo/Other  Hypothyroidism    Renal/GU Renal InsufficiencyRenal disease     Musculoskeletal  (+) Arthritis ,    Abdominal   Peds  Hematology Lab Results      Component                Value               Date                      WBC                      8.1                 02/19/2024                HGB                      13.6                02/19/2024                HCT                      41.5                02/19/2024                MCV                      91.4                02/19/2024                PLT                      402 (H)             02/19/2024              Anesthesia Other Findings Joo:dloqj nitrofurantoin, erythromycin nickel   L breast CA  Reproductive/Obstetrics                              Anesthesia Physical Anesthesia Plan  ASA: 2  Anesthesia Plan: General   Post-op Pain Management: Toradol  IV (intra-op)*, Precedex  and Tylenol  PO (pre-op)*   Induction: Intravenous  PONV Risk Score and Plan: TIVA, Propofol  infusion, Treatment may vary due to age or medical condition, Ondansetron , Midazolam , Dexamethasone  and Scopolamine patch - Pre-op  Airway Management Planned: LMA  Additional Equipment: None  Intra-op Plan:    Post-operative Plan: Extubation in OR  Informed Consent:      Dental advisory given  Plan Discussed with:   Anesthesia Plan Comments: (LMA TIVA)         Anesthesia Quick Evaluation

## 2024-02-25 ENCOUNTER — Ambulatory Visit (HOSPITAL_BASED_OUTPATIENT_CLINIC_OR_DEPARTMENT_OTHER): Admit: 2024-02-25 | Admitting: General Surgery

## 2024-02-25 ENCOUNTER — Ambulatory Visit (HOSPITAL_BASED_OUTPATIENT_CLINIC_OR_DEPARTMENT_OTHER): Payer: Self-pay | Admitting: Anesthesiology

## 2024-02-25 ENCOUNTER — Encounter (HOSPITAL_BASED_OUTPATIENT_CLINIC_OR_DEPARTMENT_OTHER): Payer: Self-pay

## 2024-02-25 ENCOUNTER — Ambulatory Visit (HOSPITAL_COMMUNITY)
Admission: RE | Admit: 2024-02-25 | Discharge: 2024-02-25 | Disposition: A | Discharge status: Home / Self Care | Attending: General Surgery | Admitting: General Surgery

## 2024-02-25 ENCOUNTER — Other Ambulatory Visit: Payer: Self-pay

## 2024-02-25 ENCOUNTER — Encounter (HOSPITAL_COMMUNITY)
Admission: RE | Disposition: A | Discharge status: Home / Self Care | Payer: Self-pay | Source: Home / Self Care | Attending: General Surgery

## 2024-02-25 ENCOUNTER — Ambulatory Visit
Admission: RE | Admit: 2024-02-25 | Discharge: 2024-02-25 | Disposition: A | Discharge status: Home / Self Care | Source: Ambulatory Visit | Attending: General Surgery | Admitting: General Surgery

## 2024-02-25 ENCOUNTER — Ambulatory Visit (HOSPITAL_COMMUNITY): Payer: Self-pay | Admitting: Physician Assistant

## 2024-02-25 ENCOUNTER — Encounter (HOSPITAL_COMMUNITY): Payer: Self-pay | Admitting: General Surgery

## 2024-02-25 DIAGNOSIS — N6489 Other specified disorders of breast: Secondary | ICD-10-CM | POA: Insufficient documentation

## 2024-02-25 DIAGNOSIS — M1990 Unspecified osteoarthritis, unspecified site: Secondary | ICD-10-CM | POA: Insufficient documentation

## 2024-02-25 DIAGNOSIS — J45909 Unspecified asthma, uncomplicated: Secondary | ICD-10-CM | POA: Diagnosis not present

## 2024-02-25 DIAGNOSIS — K219 Gastro-esophageal reflux disease without esophagitis: Secondary | ICD-10-CM | POA: Insufficient documentation

## 2024-02-25 DIAGNOSIS — N289 Disorder of kidney and ureter, unspecified: Secondary | ICD-10-CM | POA: Diagnosis not present

## 2024-02-25 DIAGNOSIS — N6081 Other benign mammary dysplasias of right breast: Secondary | ICD-10-CM | POA: Diagnosis not present

## 2024-02-25 DIAGNOSIS — N6082 Other benign mammary dysplasias of left breast: Secondary | ICD-10-CM | POA: Diagnosis not present

## 2024-02-25 SURGERY — BREAST LUMPECTOMY WITH RADIOACTIVE SEED LOCALIZATION
Anesthesia: General | Site: Breast | Laterality: Bilateral

## 2024-02-25 MED ORDER — OXYCODONE HCL 5 MG PO TABS: 5.0000 mg | ORAL_TABLET | Freq: Four times a day (QID) | ORAL | 0 refills | Status: DC | PRN

## 2024-02-25 MED ORDER — FENTANYL CITRATE (PF) 250 MCG/5ML IJ SOLN
INTRAMUSCULAR | Status: AC
  Filled 2024-02-25: qty 5

## 2024-02-25 MED ORDER — LIDOCAINE 2% (20 MG/ML) 5 ML SYRINGE
INTRAMUSCULAR | Status: AC
  Filled 2024-02-25: qty 5

## 2024-02-25 MED ORDER — MIDAZOLAM HCL 2 MG/2ML IJ SOLN
INTRAMUSCULAR | Status: DC | PRN
  Administered 2024-02-25: 2 mg via INTRAVENOUS

## 2024-02-25 MED ORDER — DEXMEDETOMIDINE HCL IN NACL 80 MCG/20ML IV SOLN
INTRAVENOUS | Status: DC | PRN
  Administered 2024-02-25: 8 ug via INTRAVENOUS

## 2024-02-25 MED ORDER — MIDAZOLAM HCL 2 MG/2ML IJ SOLN
INTRAMUSCULAR | Status: AC
Start: 2024-02-25 — End: 2024-02-25
  Filled 2024-02-25: qty 2

## 2024-02-25 MED ORDER — ONDANSETRON HCL 4 MG/2ML IJ SOLN
INTRAMUSCULAR | Status: AC
  Filled 2024-02-25: qty 2

## 2024-02-25 MED ORDER — PROPOFOL 10 MG/ML IV BOLUS
INTRAVENOUS | Status: DC | PRN
  Administered 2024-02-25: 50 mg via INTRAVENOUS
  Administered 2024-02-25: 150 mg via INTRAVENOUS

## 2024-02-25 MED ORDER — OXYCODONE HCL 5 MG/5ML PO SOLN: 5.0000 mg | Freq: Once | ORAL | Status: AC | PRN

## 2024-02-25 MED ORDER — ACETAMINOPHEN 500 MG PO TABS
1000.0000 mg | ORAL_TABLET | ORAL | Status: AC
  Administered 2024-02-25: 1000 mg via ORAL
  Filled 2024-02-25: qty 2

## 2024-02-25 MED ORDER — ACETAMINOPHEN 10 MG/ML IV SOLN: 1000.0000 mg | Freq: Once | INTRAVENOUS | Status: DC | PRN

## 2024-02-25 MED ORDER — GABAPENTIN 100 MG PO CAPS
100.0000 mg | ORAL_CAPSULE | ORAL | Status: AC
  Administered 2024-02-25: 100 mg via ORAL
  Filled 2024-02-25: qty 1

## 2024-02-25 MED ORDER — ORAL CARE MOUTH RINSE: 15.0000 mL | Freq: Once | OROMUCOSAL | Status: AC

## 2024-02-25 MED ORDER — LIDOCAINE 2% (20 MG/ML) 5 ML SYRINGE
INTRAMUSCULAR | Status: DC | PRN
  Administered 2024-02-25: 60 mg via INTRAVENOUS

## 2024-02-25 MED ORDER — PROPOFOL 500 MG/50ML IV EMUL
INTRAVENOUS | Status: DC | PRN
Start: 2024-02-25 — End: 2024-02-25
  Administered 2024-02-25: 125 ug/kg/min via INTRAVENOUS

## 2024-02-25 MED ORDER — 0.9 % SODIUM CHLORIDE (POUR BTL) OPTIME
TOPICAL | Status: DC | PRN
  Administered 2024-02-25: 1000 mL

## 2024-02-25 MED ORDER — CEFAZOLIN SODIUM-DEXTROSE 2-4 GM/100ML-% IV SOLN
2.0000 g | INTRAVENOUS | Status: AC
  Administered 2024-02-25: 2 g via INTRAVENOUS
  Filled 2024-02-25: qty 100

## 2024-02-25 MED ORDER — BUPIVACAINE-EPINEPHRINE 0.25% -1:200000 IJ SOLN
INTRAMUSCULAR | Status: DC | PRN
  Administered 2024-02-25: 30 mL

## 2024-02-25 MED ORDER — BUPIVACAINE-EPINEPHRINE (PF) 0.25% -1:200000 IJ SOLN
INTRAMUSCULAR | Status: AC
  Filled 2024-02-25: qty 30

## 2024-02-25 MED ORDER — PHENYLEPHRINE 80 MCG/ML (10ML) SYRINGE FOR IV PUSH (FOR BLOOD PRESSURE SUPPORT)
PREFILLED_SYRINGE | INTRAVENOUS | Status: AC
  Filled 2024-02-25: qty 10

## 2024-02-25 MED ORDER — HYDROMORPHONE HCL 1 MG/ML IJ SOLN: 0.2500 mg | INTRAMUSCULAR | Status: DC | PRN

## 2024-02-25 MED ORDER — PROPOFOL 10 MG/ML IV BOLUS
INTRAVENOUS | Status: AC
  Filled 2024-02-25: qty 20

## 2024-02-25 MED ORDER — PHENYLEPHRINE 80 MCG/ML (10ML) SYRINGE FOR IV PUSH (FOR BLOOD PRESSURE SUPPORT)
PREFILLED_SYRINGE | INTRAVENOUS | Status: DC | PRN
  Administered 2024-02-25: 80 ug via INTRAVENOUS

## 2024-02-25 MED ORDER — ONDANSETRON HCL 4 MG/2ML IJ SOLN
INTRAMUSCULAR | Status: DC | PRN
  Administered 2024-02-25: 4 mg via INTRAVENOUS

## 2024-02-25 MED ORDER — OXYCODONE HCL 5 MG PO TABS
5.0000 mg | ORAL_TABLET | Freq: Once | ORAL | Status: AC | PRN
  Administered 2024-02-25: 5 mg via ORAL

## 2024-02-25 MED ORDER — AMISULPRIDE (ANTIEMETIC) 5 MG/2ML IV SOLN: 10.0000 mg | Freq: Once | INTRAVENOUS | Status: DC | PRN

## 2024-02-25 MED ORDER — CHLORHEXIDINE GLUCONATE CLOTH 2 % EX PADS: 6.0000 | MEDICATED_PAD | Freq: Once | CUTANEOUS | Status: DC

## 2024-02-25 MED ORDER — CHLORHEXIDINE GLUCONATE 0.12 % MT SOLN
15.0000 mL | Freq: Once | OROMUCOSAL | Status: AC
  Administered 2024-02-25: 15 mL via OROMUCOSAL
  Filled 2024-02-25: qty 15

## 2024-02-25 MED ORDER — OXYCODONE HCL 5 MG PO TABS
ORAL_TABLET | ORAL | Status: AC
  Filled 2024-02-25: qty 1

## 2024-02-25 MED ORDER — ONDANSETRON HCL 4 MG/2ML IJ SOLN: 4.0000 mg | Freq: Once | INTRAMUSCULAR | Status: DC | PRN

## 2024-02-25 MED ORDER — FENTANYL CITRATE (PF) 250 MCG/5ML IJ SOLN
INTRAMUSCULAR | Status: DC | PRN
  Administered 2024-02-25: 50 ug via INTRAVENOUS

## 2024-02-25 MED ORDER — LACTATED RINGERS IV SOLN: INTRAVENOUS | Status: DC

## 2024-02-25 SURGICAL SUPPLY — 24 items
CANISTER SUCTION 3000ML PPV (SUCTIONS) ×2 IMPLANT
CHLORAPREP W/TINT 26 (MISCELLANEOUS) ×2 IMPLANT
CLIP APPLIE 9.375 MED OPEN (MISCELLANEOUS) IMPLANT
COVER PROBE W GEL 5X96 (DRAPES) ×2 IMPLANT
COVER SURGICAL LIGHT HANDLE (MISCELLANEOUS) ×2 IMPLANT
DERMABOND ADVANCED .7 DNX12 (GAUZE/BANDAGES/DRESSINGS) ×2 IMPLANT
DEVICE DUBIN SPECIMEN MAMMOGRA (MISCELLANEOUS) ×2 IMPLANT
DRAPE CHEST BREAST 15X10 FENES (DRAPES) ×2 IMPLANT
ELECT COATED BLADE 2.86 ST (ELECTRODE) ×2 IMPLANT
ELECTRODE REM PT RTRN 9FT ADLT (ELECTROSURGICAL) ×2 IMPLANT
GLOVE BIO SURGEON STRL SZ7.5 (GLOVE) ×4 IMPLANT
GOWN STRL REUS W/ TWL LRG LVL3 (GOWN DISPOSABLE) ×4 IMPLANT
KIT BASIN OR (CUSTOM PROCEDURE TRAY) ×2 IMPLANT
KIT MARKER MARGIN INK (KITS) ×2 IMPLANT
LIGHT WAVEGUIDE WIDE FLAT (MISCELLANEOUS) IMPLANT
NDL HYPO 25GX1X1/2 BEV (NEEDLE) ×2 IMPLANT
NEEDLE HYPO 25GX1X1/2 BEV (NEEDLE) ×1 IMPLANT
NS IRRIG 1000ML POUR BTL (IV SOLUTION) ×2 IMPLANT
PACK GENERAL/GYN (CUSTOM PROCEDURE TRAY) ×2 IMPLANT
SUT MNCRL AB 4-0 PS2 18 (SUTURE) ×2 IMPLANT
SUT SILK 2 0 SH (SUTURE) IMPLANT
SUT VIC AB 3-0 SH 18 (SUTURE) ×2 IMPLANT
SYR CONTROL 10ML LL (SYRINGE) ×2 IMPLANT
TOWEL GREEN STERILE FF (TOWEL DISPOSABLE) ×2 IMPLANT

## 2024-02-25 NOTE — H&P (Signed)
 REFERRING PHYSICIAN: Frederik Lamar POUR, MD PROVIDER: DEWARD GARNETTE NULL, MD MRN: [id number] DOB: Jun 06, 1961 Subjective    Chief Complaint: Breast Problem  History of Present Illness: [name] is a 63 y.o. female who is seen today as an office consultation for evaluation of Breast Problem  We are asked to see the patient in consultation by Dr. Lamar Frederik to evaluate her for complex sclerosing lesions on both breast. The patient is a 63 year old white female who recently went for a routine screening mammogram. At that time she was found to have distortions in the lateral aspect of the left breast in the upper outer quadrant of the right breast. These were biopsied and both came back as complex sclerosing lesions. She has had problems with dense breast tissue in the past. She denies any family history of breast cancer. She is otherwise in good health and does not smoke. She does have retropectoral implants that were put in about 30 years ago that are smooth and silicone. She has been considering removing the implants for a long time. She also has very large breasts that are heavy and cause her some significant back and neck and shoulder pain.  Review of Systems: A complete review of systems was obtained from the patient. I have reviewed this information and discussed as appropriate with the patient. See HPI as well for other ROS.  ROS   Medical History: Past Medical History:  Diagnosis Date  Arthritis  Asthma, unspecified asthma severity, unspecified whether complicated, unspecified whether persistent ([facility])  Chronic kidney disease  GERD (gastroesophageal reflux disease)   Patient Active Problem List  Diagnosis  Complex sclerosing lesion of left breast  Complex sclerosing lesion of right breast   Past Surgical History:  Procedure Laterality Date  PERCUTANEOUS BIOPSY BREAST W/NEEDLE LOCALIZATION Left 12/21/2023  PERCUTANEOUS BIOPSY BREAST W/NEEDLE LOCALIZATION Right 12/21/2023   PERCUTANEOUS BIOPSY BREAST W/NEEDLE LOCALIZATION Left 12/21/2023  augmentation mammoplasy surgery Bilateral  DIAGNOSTIC LAPAROSCOPY  HYSTERECTOMY  REPLACEMENT TOTAL KNEE Right    Allergies  Allergen Reactions  Erythromycin Base Other (See Comments) and Nausea And Vomiting  Nickel Dermatitis, Itching, Rash and Swelling  Nitrofurantoin Anaphylaxis and Shortness Of Breath  Erythromycin Nausea And Vomiting, Other (See Comments) and Vomiting  Sulfa (Sulfonamide Antibiotics) Palpitations, Rash and Other (See Comments)  Patient mentions redness of skin also.   Current Outpatient Medications on File Prior to Visit  Medication Sig Dispense Refill  ergocalciferol , vitamin D2, 10 mcg (400 unit) Tab Take by mouth  fenofibrate  160 MG tablet Take 160 mg by mouth  montelukast  (SINGULAIR ) 10 mg tablet Take 10 mg by mouth at bedtime  multivitamin with B complex-vitamin C (FARBEE WITH C) tablet Take 1 tablet by mouth once daily  pantoprazole  (PROTONIX ) 20 MG DR tablet Take 20 mg by mouth once daily   No current facility-administered medications on file prior to visit.   Family History  Problem Relation Age of Onset  Skin cancer Mother  Deep vein thrombosis (DVT or abnormal blood clot formation) Mother  Coronary Artery Disease (Blocked arteries around heart) Father  High blood pressure (Hypertension) Father  Hyperlipidemia (Elevated cholesterol) Father  Skin cancer Sister  Obesity Brother  Diabetes Brother    Social History   Tobacco Use  Smoking Status Never  Smokeless Tobacco Never    Social History   Socioeconomic History  Marital status: Married  Tobacco Use  Smoking status: Never  Smokeless tobacco: Never  Substance and Sexual Activity  Alcohol  use: Yes  Drug use:  Never   Social Drivers of Health   Housing Stability: Unknown (12/29/2023)  Housing Stability Vital Sign  Homeless in the Last Year: No   Objective:   Vitals:  BP: 131/84  Pulse: 72  Temp: 36.7 C (98  F)  SpO2: 97%  Weight: 94 kg (207 lb 3.2 oz)  Height: 167.6 cm (5' 6)  PainSc: 0-No pain  PainLoc: Breast   Body mass index is 33.44 kg/m.  Physical Exam Vitals reviewed.  Constitutional:  General: She is not in acute distress. Appearance: Normal appearance.  HENT:  Head: Normocephalic and atraumatic.  Right Ear: External ear normal.  Left Ear: External ear normal.  Nose: Nose normal.  Mouth/Throat:  Mouth: Mucous membranes are moist.  Pharynx: Oropharynx is clear.  Eyes:  General: No scleral icterus. Extraocular Movements: Extraocular movements intact.  Conjunctiva/sclera: Conjunctivae normal.  Pupils: Pupils are equal, round, and reactive to light.  Cardiovascular:  Rate and Rhythm: Normal rate and regular rhythm.  Pulses: Normal pulses.  Heart sounds: Normal heart sounds.  Pulmonary:  Effort: Pulmonary effort is normal. No respiratory distress.  Breath sounds: Normal breath sounds.  Abdominal:  General: Bowel sounds are normal.  Palpations: Abdomen is soft.  Tenderness: There is no abdominal tenderness.  Musculoskeletal:  General: No swelling, tenderness or deformity. Normal range of motion.  Cervical back: Normal range of motion and neck supple.  Skin: General: Skin is warm and dry.  Coloration: Skin is not jaundiced.  Neurological:  General: No focal deficit present.  Mental Status: She is alert and oriented to person, place, and time.  Psychiatric:  Mood and Affect: Mood normal.  Behavior: Behavior normal.     Breast: There is no palpable mass in either breast. There is no palpable axillary, supraclavicular, or cervical lymphadenopathy.  Labs, Imaging and Diagnostic Testing:  Assessment and Plan:   Diagnoses and all orders for this visit:  Complex sclerosing lesion of left breast - Ambulatory Referral to Plastic Surgery  Complex sclerosing lesion of right breast - Ambulatory Referral to Plastic Surgery   The patient appears to have 2 small  areas of complex sclerosing lesion in both breasts. At this point the risk of upstaging is fairly low at 1 to 3%. Her options for management at this point would include observation in which case we would see her back again in 6 months with repeat mammogram and ultrasound versus surgical excision. Since she is considering having the implants removed and possible reduction surgery I will refer her to Dr. Lowery and plastic surgery for this. She will then make up her mind whether she would like to move forward with surgery or continue with observation. We will plan for 30-month follow-up unless she definitively decides on surgery.  Plan for bilateral rsl lumpectomies

## 2024-02-25 NOTE — Anesthesia Procedure Notes (Signed)
 Procedure Name: LMA Insertion Date/Time: 02/25/2024 10:45 AM  Performed by: Marva Lonni PARAS, CRNAPre-anesthesia Checklist: Patient identified, Emergency Drugs available, Suction available and Patient being monitored Patient Re-evaluated:Patient Re-evaluated prior to induction Oxygen Delivery Method: Circle System Utilized Preoxygenation: Pre-oxygenation with 100% oxygen Induction Type: IV induction Ventilation: Mask ventilation without difficulty LMA: LMA inserted LMA Size: 4.0 Number of attempts: 1 Airway Equipment and Method: Bite block Placement Confirmation: positive ETCO2 Tube secured with: Tape Dental Injury: Teeth and Oropharynx as per pre-operative assessment

## 2024-02-25 NOTE — Interval H&P Note (Signed)
 History and Physical Interval Note:  02/25/2024 10:15 AM  [name]  has presented today for surgery, with the diagnosis of BILATERAL BREAST CSL.  The various methods of treatment have been discussed with the patient and family. After consideration of risks, benefits and other options for treatment, the patient has consented to  Procedure(s): BREAST LUMPECTOMY WITH RADIOACTIVE SEED LOCALIZATION (Bilateral) as a surgical intervention.  The patient's history has been reviewed, patient examined, no change in status, stable for surgery.  I have reviewed the patient's chart and labs.  Questions were answered to the patient's satisfaction.     Deward Null III

## 2024-02-25 NOTE — Anesthesia Postprocedure Evaluation (Signed)
 Anesthesia Post Note  Patient: [name]  Procedure(s) Performed: BREAST LUMPECTOMY WITH RADIOACTIVE SEED LOCALIZATION (Bilateral: Breast)     Patient location during evaluation: PACU Anesthesia Type: General Level of consciousness: awake and alert Pain management: pain level controlled Vital Signs Assessment: post-procedure vital signs reviewed and stable Respiratory status: spontaneous breathing, nonlabored ventilation, respiratory function stable and patient connected to nasal cannula oxygen Cardiovascular status: blood pressure returned to baseline and stable Postop Assessment: no apparent nausea or vomiting Anesthetic complications: no   No notable events documented.  Last Vitals:  Vitals:   02/25/24 1215 02/25/24 1230  BP: 116/72 119/69  Pulse: 61 66  Resp: 14 16  Temp:  36.4 C  SpO2: 92% 94%    Last Pain:  Vitals:   02/25/24 1235  TempSrc:   PainSc: 4                  [name]

## 2024-02-25 NOTE — Transfer of Care (Signed)
 Immediate Anesthesia Transfer of Care Note  Patient: [name]  Procedure(s) Performed: BREAST LUMPECTOMY WITH RADIOACTIVE SEED LOCALIZATION (Bilateral: Breast)  Patient Location: PACU  Anesthesia Type:General  Level of Consciousness: awake, patient cooperative, and responds to stimulation  Airway & Oxygen Therapy: Patient Spontanous Breathing  Post-op Assessment: Report given to RN and Patient moving all extremities X 4  Post vital signs: Reviewed and stable  Last Vitals:  Vitals Value Taken Time  BP 105/72 02/25/24 11:52  Temp    Pulse 73 02/25/24 11:54  Resp 16 02/25/24 11:54  SpO2 95 % 02/25/24 11:54  Vitals shown include unfiled device data.  Last Pain:  Vitals:   02/25/24 0928  TempSrc:   PainSc: 0-No pain         Complications: No notable events documented.

## 2024-02-25 NOTE — Op Note (Signed)
 02/25/2024  11:48 AM  PATIENT:  [name]  63 y.o. female  PRE-OPERATIVE DIAGNOSIS:  BILATERAL BREAST CSL  POST-OPERATIVE DIAGNOSIS:  BILATERAL BREAST CSL  PROCEDURE:  Procedure(s): BILATERAL BREAST LUMPECTOMY WITH RADIOACTIVE SEED LOCALIZATION (Bilateral)  SURGEON:  Surgeons and Role:    DEWAINE Curvin Deward DOUGLAS, MD - Primary  PHYSICIAN ASSISTANT:   ASSISTANTS: none   ANESTHESIA:   local and general  EBL:  minimal   BLOOD ADMINISTERED:none  DRAINS: none   LOCAL MEDICATIONS USED:  MARCAINE      SPECIMEN:  Source of Specimen:  bilateral lumpectomies  DISPOSITION OF SPECIMEN:  PATHOLOGY  COUNTS:  YES  TOURNIQUET:  * No tourniquets in log *  DICTATION: .Dragon Dictation  After informed consent was obtained the patient was brought to the operating room and placed in the supine position on the operating table.  After adequate induction of general anesthesia the patient's bilateral breast were prepped with ChloraPrep, allowed to dry, and draped in usual sterile manner.  An appropriate timeout was performed.  Previously an I-125 seed was placed in each breast to mark areas of complex sclerosing lesion.  Attention was first turned to the left breast.  The neoprobe was set to I-125 in the area of radioactivity was readily identified in the lateral aspect of the left breast.  The area around this was infiltrated with quarter percent Marcaine .  A curvilinear incision was made along the outer aspect of the left breast with a 15 blade knife.  The incision was carried through the skin and subcutaneous tissue sharply with the electrocautery.  Dissection was then carried towards the radioactive seed under the direction of the neoprobe.  Once I more closely approach the radioactive seed I then removed a circular portion of breast tissue sharply with the electrocautery around the radioactive seed while checking the area of radioactivity frequently.  There did seem to be a circular portion of abnormal  tissue that almost look like lymph node tissue in this location.  Once the tissue was removed it was oriented with the appropriate paint colors.  A specimen radiograph was then obtained that showed the clip and seed to be within the specimen.  The specimen was then sent to pathology for further evaluation.  Hemostasis was achieved using the Bovie electrocautery.  The wound was irrigated with saline and infiltrated with more quarter percent Marcaine .  The deep layer of the wound was closed with interrupted 3-0 Vicryl stitches.  The skin was then closed with a running 4-0 Monocryl subcuticular stitch.  Attention was then turned to the right breast.  The neoprobe was used to identify the radioactive seed in the upper outer central right breast.  The area around this was infiltrated with quarter percent Marcaine .  A curvilinear incision was made along the upper outer edge of the areola of the right breast with a 15 blade knife.  The incision was carried through the skin and subcutaneous tissue sharply with the electrocautery.  Dissection was then carried towards the radioactive seed under the direction of the neoprobe.  Once I more closely approach the radioactive seed I then removed a circular portion of breast tissue sharply with the electrocautery around the radioactive seed while checking the area of radioactivity frequently.  Once the tissue was removed it was oriented with the appropriate paint colors.  A specimen radiograph was obtained that showed the clip and seed to be within the specimen.  The specimen was then sent to pathology for further evaluation.  Hemostasis was achieved using the Bovie electrocautery.  The wound was irrigated with saline and infiltrated with more quarter percent Marcaine .  The deep layer of the wound was then closed with interrupted 3-0 Vicryl stitches.  The skin was closed with interrupted 4-0 Monocryl subcuticular stitches.  Dermabond dressings were applied.  The patient tolerated  the procedure well.  At the end of the case all needle sponge and instrument counts were correct.  The patient was then awakened and taken to recovery in stable condition.  PLAN OF CARE: Discharge to home after PACU  PATIENT DISPOSITION:  PACU - hemodynamically stable.   Delay start of Pharmacological VTE agent (>24hrs) due to surgical blood loss or risk of bleeding: not applicable

## 2024-02-26 ENCOUNTER — Encounter (HOSPITAL_COMMUNITY): Payer: Self-pay | Admitting: General Surgery

## 2024-03-01 LAB — SURGICAL PATHOLOGY

## 2024-03-30 DIAGNOSIS — J4540 Moderate persistent asthma, uncomplicated: Secondary | ICD-10-CM | POA: Diagnosis not present

## 2024-03-30 DIAGNOSIS — Z6833 Body mass index (BMI) 33.0-33.9, adult: Secondary | ICD-10-CM | POA: Diagnosis not present

## 2024-03-30 DIAGNOSIS — R051 Acute cough: Secondary | ICD-10-CM | POA: Diagnosis not present

## 2024-04-06 NOTE — Progress Notes (Unsigned)
 Patient ID: [name], female    DOB: [birth year], 63 y.o.   MRN: [id number]  No chief complaint on file.   No diagnosis found.   History of Present Illness: [name] is a 63 y.o.  female  with a history of breast augmentation.  She presents for preoperative evaluation for upcoming procedure, removal of bilateral breast implants with capsulectomies and mastopexy, scheduled for 01/05/2024 with Dr. Lowery.  The patient {HAS HAS [id number]} had problems with anesthesia. ***  Summary of Previous Visit: She was seen for initial consult with Dr. Lowery 01/05/2024.  At that time, reported previous history of silicone breast implants many years ago.  Mastopexy was performed at same time.  She now is requiring breast biopsies by Dr. Curvin with general surgery.  She is hoping that she can have removal of implants with capsulectomy and mastopexy at the same time.  Job: ***  PMH Significant for: History of breast augmentation   Past Medical History: Allergies: Allergies  Allergen Reactions   Nickel Dermatitis, Itching, Rash and Swelling   Nitrofurantoin Shortness Of Breath   Erythromycin Diarrhea and Nausea And Vomiting   Sulfa Antibiotics Palpitations and Rash    Patient mentions redness of skin also.     Current Medications:  Current Outpatient Medications:    ADVAIR HFA 115-21 MCG/ACT inhaler, Inhale 2 puffs into the lungs 2 (two) times daily as needed (Asthma)., Disp: , Rfl:    albuterol  (VENTOLIN  HFA) 108 (90 Base) MCG/ACT inhaler, Inhale 2 puffs into the lungs every 6 (six) hours as needed for wheezing or shortness of breath., Disp: , Rfl:    b complex vitamins capsule, Take 1 capsule by mouth daily., Disp: , Rfl:    Dextran 70-Hypromellose (ARTIFICIAL TEARS) 0.1-0.3 % SOLN, Place 1 drop into both eyes daily as needed (dry eyes)., Disp: , Rfl:    fenofibrate  160 MG tablet, Take 160 mg by mouth every evening., Disp: , Rfl:    montelukast  (SINGULAIR ) 10 MG tablet, Take  10 mg by mouth at bedtime., Disp: , Rfl:    Omega-3 Fatty Acids (FISH OIL) 1000 MG CAPS, Take 1,000 mg by mouth daily. With Vitamin D , Disp: , Rfl:    oxyCODONE  (ROXICODONE ) 5 MG immediate release tablet, Take 1 tablet (5 mg total) by mouth every 6 (six) hours as needed., Disp: 15 tablet, Rfl: 0   pantoprazole  (PROTONIX ) 20 MG tablet, Take 20 mg by mouth daily., Disp: , Rfl:    sodium chloride  (OCEAN) 0.65 % SOLN nasal spray, Place 1 spray into both nostrils as needed for congestion., Disp: , Rfl:    Spacer/Aero-Holding Chambers DEVI, Prescribe to be used with asthma, Disp: 1 each, Rfl: 0   TURMERIC PO, Take 1 each by mouth daily., Disp: , Rfl:    zolpidem (AMBIEN CR) 6.25 MG CR tablet, Take 6.25 mg by mouth at bedtime as needed for sleep., Disp: , Rfl:   Past Medical Problems: Past Medical History:  Diagnosis Date   Arthritis    Asthma    Endometriosis    GERD (gastroesophageal reflux disease)    History of kidney stones    History of migraine headaches    Hyperlipidemia    Hypothyroidism    Insomnia    Pneumonia    PONV (postoperative nausea and vomiting)    Skin cancer    Face    Past Surgical History: Past Surgical History:  Procedure Laterality Date   ABDOMINAL HYSTERECTOMY  AUGMENTATION MAMMAPLASTY Bilateral    BREAST BIOPSY Left 12/21/2023   MM LT BREAST BX W LOC DEV 1ST LESION IMAGE BX SPEC STEREO GUIDE 12/21/2023 [facility]   BREAST BIOPSY Right 12/21/2023   MM RT BREAST BX W LOC DEV 1ST LESION IMAGE BX SPEC STEREO GUIDE 12/21/2023 [facility]   BREAST BIOPSY Left 12/21/2023   MM LT BREAST BX W LOC DEV EA AD LESION IMG BX SPEC STEREO GUIDE 12/21/2023 [facility]   BREAST BIOPSY  02/23/2024   MM LT RADIOACTIVE SEED LOC MAMMO GUIDE 02/23/2024 [facility]   BREAST BIOPSY  02/23/2024   MM RT RADIOACTIVE SEED LOC MAMMO GUIDE 02/23/2024 [facility]   BREAST LUMPECTOMY WITH RADIOACTIVE SEED LOCALIZATION Bilateral 02/25/2024   Procedure: BREAST  LUMPECTOMY WITH RADIOACTIVE SEED LOCALIZATION;  Surgeon: Curvin Deward MOULD, MD;  Location: [facility];  Service: General;  Laterality: Bilateral;   LAPAROSCOPY     SKIN CANCER EXCISION     TOTAL KNEE ARTHROPLASTY Right 12/21/2020   Procedure: RIGHT TOTAL KNEE ARTHROPLASTY;  Surgeon: Vernetta Lonni GRADE, MD;  Location: [facility];  Service: Orthopedics;  Laterality: Right;   URETHRAL DILATION      Social History: Social History   Socioeconomic History   Marital status: Married    Spouse name: Not on file   Number of children: Not on file   Years of education: Not on file   Highest education level: Not on file  Occupational History   Not on file  Tobacco Use   Smoking status: Never   Smokeless tobacco: Never  Vaping Use   Vaping status: Never Used  Substance and Sexual Activity   Alcohol  use: Yes    Comment: Rare wine   Drug use: Never   Sexual activity: Not on file  Other Topics Concern   Not on file  Social History Narrative   Not on file   Social Drivers of Health   Financial Resource Strain: Not on file  Food Insecurity: Not on file  Transportation Needs: Not on file  Physical Activity: Not on file  Stress: Not on file  Social Connections: Unknown (12/13/2021)   [facility]   Social Network    Social Network: Not on file  Intimate Partner Violence: Unknown (11/05/2021)   Received from [facility]   HITS    Physically Hurt: Not on file    Insult or Talk Down To: Not on file    Threaten Physical Harm: Not on file    Scream or Curse: Not on file    Family History: Family History  Problem Relation Age of Onset   Lung disease Neg Hx     Review of Systems: ROS  Physical Exam: Vital Signs There were no vitals taken for this visit.  Physical Exam *** Constitutional:      General: Not in acute distress.    Appearance: Normal appearance. Not ill-appearing.  HENT:     Head: Normocephalic and atraumatic.  Eyes:     Pupils: Pupils are equal,  round. Cardiovascular:     Rate and Rhythm: Normal rate.    Pulses: Normal pulses.  Pulmonary:     Effort: No respiratory distress or increased work of breathing.  Speaks in full sentences. Abdominal:     General: Abdomen is flat. No distension.   Musculoskeletal: Normal range of motion. No lower extremity swelling or edema. No varicosities. *** Skin:    General: Skin is warm and dry.     Findings: No  erythema or rash.  Neurological:     Mental Status: Alert and oriented to person, place, and time.  Psychiatric:        Mood and Affect: Mood normal.        Behavior: Behavior normal.    Assessment/Plan: The patient is scheduled for *** with Dr. Kelleen Lowery [place].  Risks, benefits, and alternatives of procedure discussed, questions answered and consent obtained.    Smoking Status: ***; Counseling Given? *** Last Mammogram: ***; Results: ***  Caprini Score: ***; Risk Factors include: ***, BMI *** 25, and length of planned surgery. Recommendation for mechanical *** pharmacological prophylaxis. Encourage early ambulation.   Pictures obtained: ***  Post-op Rx sent to pharmacy: ***  Patient was provided with the *** General Surgical Risk consent document and Pain Medication Agreement prior to their appointment.  They had adequate time to read through the risk consent documents and Pain Medication Agreement. We also discussed them in person together during this preop appointment. All of their questions were answered to their satisfaction.  Recommended calling if they have any further questions.  Risk consent form and Pain Medication Agreement to be scanned into patient's chart.  ***   Electronically signed by: Honora Seip, PA-C 04/06/2024 3:24 PM

## 2024-04-07 ENCOUNTER — Ambulatory Visit (INDEPENDENT_AMBULATORY_CARE_PROVIDER_SITE_OTHER): Payer: Self-pay | Admitting: Physician Assistant

## 2024-04-07 VITALS — BP 118/65 | HR 72 | Ht 66.0 in | Wt 203.7 lb

## 2024-04-07 DIAGNOSIS — Z9882 Breast implant status: Secondary | ICD-10-CM

## 2024-04-07 MED ORDER — OXYCODONE HCL 5 MG PO TABS: 5.0000 mg | ORAL_TABLET | Freq: Three times a day (TID) | ORAL | 0 refills | Status: AC | PRN

## 2024-04-07 MED ORDER — ONDANSETRON 4 MG PO TBDP: 4.0000 mg | ORAL_TABLET | Freq: Three times a day (TID) | ORAL | 0 refills | Status: AC | PRN

## 2024-04-07 MED ORDER — CEPHALEXIN 500 MG PO CAPS: 500.0000 mg | ORAL_CAPSULE | Freq: Four times a day (QID) | ORAL | 0 refills | Status: AC

## 2024-04-08 ENCOUNTER — Encounter: Admitting: Physician Assistant

## 2024-04-27 DIAGNOSIS — Z411 Encounter for cosmetic surgery: Secondary | ICD-10-CM | POA: Diagnosis not present

## 2024-05-06 ENCOUNTER — Ambulatory Visit: Admitting: Plastic Surgery

## 2024-05-06 VITALS — BP 150/78 | HR 60 | Ht 66.0 in | Wt 206.5 lb

## 2024-05-06 DIAGNOSIS — N62 Hypertrophy of breast: Secondary | ICD-10-CM

## 2024-05-06 NOTE — Progress Notes (Signed)
 The patient is a 63 year old female here for follow-up after undergoing removal of implants and mastopexy.  I was able to remove her drains.  She is doing really well.  She has some swelling as expected.  A little bit of bruising.  No sign of infection or seroma.  Plan for pictures at her next visit and continue with the sports bra.

## 2024-05-25 NOTE — Progress Notes (Signed)
 Patient is a very pleasant 63 year old with history of breast augmentation now s/p removal of bilateral breast implants with capsulectomies and mastopexy performed 04/27/2024 by Dr. Lowery who presents to clinic for postoperative follow-up.  She was last seen here in clinic on 05/06/2024.  At that time, her drains were removed and she was doing well.  No seroma.  Plan for photos at next visit.  Today, patient is doing quite well from postoperative standpoint.  She has been applying Vaseline, as we discussed.  This is helped with residual Dermabond.  She does have a couple small sutures that she is hoping to be removed.  Overall, she is quite pleased with the outcome.  Inquired about ongoing restrictions.  On exam, breast have excellent shape and symmetry.  Soft throughout.  Residual areolar sutures are removed without complication, well-tolerated by patient.  No significant residual Dermabond.  Recommending Vaseline for another 4 to 5 days and then transition to silicone scar gel twice daily x 3 months.  This can also be applied to her lumpectomy scars.  She may begin to increase activity as tolerated.  Continue to avoid submerging breast in water until any small wounds are fully healed.  Follow-up only as needed.  Picture(s) obtained of the patient and placed in the chart were with the patient's or guardian's permission.

## 2024-05-30 ENCOUNTER — Ambulatory Visit (INDEPENDENT_AMBULATORY_CARE_PROVIDER_SITE_OTHER): Payer: Self-pay | Admitting: Physician Assistant

## 2024-05-30 VITALS — BP 136/93 | HR 78

## 2024-05-30 DIAGNOSIS — Z9889 Other specified postprocedural states: Secondary | ICD-10-CM

## 2024-05-30 DIAGNOSIS — Z719 Counseling, unspecified: Secondary | ICD-10-CM
